# Patient Record
Sex: Female | Born: 1940 | Race: White | Hispanic: No | Marital: Single | Smoking: Former smoker
Health system: Southern US, Community
[De-identification: ages and names within clinical notes are randomized; demographics above are authoritative.]

## PROBLEM LIST (undated history)

## (undated) DIAGNOSIS — K5900 Constipation, unspecified: Secondary | ICD-10-CM

## (undated) DIAGNOSIS — I10 Essential (primary) hypertension: Secondary | ICD-10-CM

## (undated) DIAGNOSIS — E119 Type 2 diabetes mellitus without complications: Secondary | ICD-10-CM

## (undated) DIAGNOSIS — H9190 Unspecified hearing loss, unspecified ear: Secondary | ICD-10-CM

## (undated) DIAGNOSIS — G709 Myoneural disorder, unspecified: Secondary | ICD-10-CM

## (undated) DIAGNOSIS — F32A Depression, unspecified: Secondary | ICD-10-CM

## (undated) DIAGNOSIS — E785 Hyperlipidemia, unspecified: Secondary | ICD-10-CM

## (undated) DIAGNOSIS — Z972 Presence of dental prosthetic device (complete) (partial): Secondary | ICD-10-CM

## (undated) DIAGNOSIS — T7840XA Allergy, unspecified, initial encounter: Secondary | ICD-10-CM

## (undated) DIAGNOSIS — F419 Anxiety disorder, unspecified: Secondary | ICD-10-CM

## (undated) DIAGNOSIS — E1143 Type 2 diabetes mellitus with diabetic autonomic (poly)neuropathy: Secondary | ICD-10-CM

## (undated) DIAGNOSIS — H269 Unspecified cataract: Secondary | ICD-10-CM

## (undated) DIAGNOSIS — K219 Gastro-esophageal reflux disease without esophagitis: Secondary | ICD-10-CM

## (undated) DIAGNOSIS — F329 Major depressive disorder, single episode, unspecified: Secondary | ICD-10-CM

## (undated) HISTORY — DX: Type 2 diabetes mellitus without complications: E11.9

## (undated) HISTORY — DX: Myoneural disorder, unspecified: G70.9

## (undated) HISTORY — DX: Type 2 diabetes mellitus with diabetic autonomic (poly)neuropathy: E11.43

## (undated) HISTORY — DX: Essential (primary) hypertension: I10

## (undated) HISTORY — DX: Allergy, unspecified, initial encounter: T78.40XA

## (undated) HISTORY — DX: Unspecified cataract: H26.9

## (undated) HISTORY — PX: HEMORRHOID SURGERY: SHX153

## (undated) HISTORY — DX: Major depressive disorder, single episode, unspecified: F32.9

## (undated) HISTORY — DX: Presence of dental prosthetic device (complete) (partial): Z97.2

## (undated) HISTORY — DX: Gastro-esophageal reflux disease without esophagitis: K21.9

## (undated) HISTORY — DX: Constipation, unspecified: K59.00

## (undated) HISTORY — DX: Anxiety disorder, unspecified: F41.9

## (undated) HISTORY — DX: Unspecified hearing loss, unspecified ear: H91.90

## (undated) HISTORY — DX: Depression, unspecified: F32.A

## (undated) HISTORY — DX: Hyperlipidemia, unspecified: E78.5

---
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
## 1989-04-16 HISTORY — PX: ABDOMINAL HYSTERECTOMY: SHX81

## 1990-04-16 HISTORY — PX: CHOLECYSTECTOMY: SHX55

## 2000-04-16 HISTORY — PX: KIDNEY SURGERY: SHX687

## 2007-04-17 HISTORY — PX: LUMBAR LAMINECTOMY: SHX95

## 2008-04-23 ENCOUNTER — Ambulatory Visit (HOSPITAL_COMMUNITY): Admission: RE | Admit: 2008-04-23 | Discharge: 2008-04-23 | Payer: Self-pay | Admitting: Endocrinology

## 2009-11-20 ENCOUNTER — Emergency Department (HOSPITAL_COMMUNITY): Admission: EM | Admit: 2009-11-20 | Discharge: 2009-11-20 | Payer: Self-pay | Admitting: Emergency Medicine

## 2012-01-23 IMAGING — CT CT HEAD W/O CM
3 of 6 series · 15 of 47 positions shown, 18 images · non-contrast
Comparison: None.

CT HEAD

CLINICAL DATA: Fall, trauma, headache, neck pain

CT HEAD WITHOUT CONTRAST
CT CERVICAL SPINE WITHOUT CONTRAST
TECHNIQUE: Multidetector CT imaging of the head and cervical spine
was performed following the standard protocol without intravenous
contrast.  Multiplanar CT image reconstructions of the cervical
spine were also generated.

[Series 600: reformatted · coronal · 0.40mm/px · 3 of 61 slices shown (1 of 3)]
[im 21/61  brain]
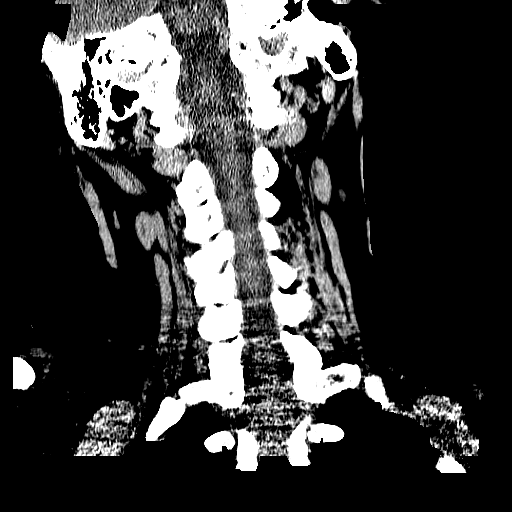
[im 27/61  brain]
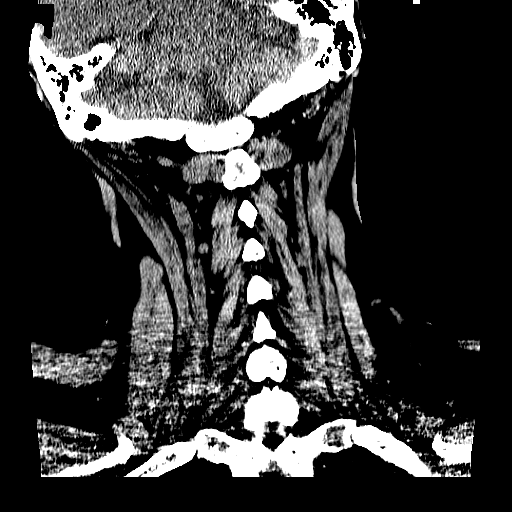
[im 34/61  brain]
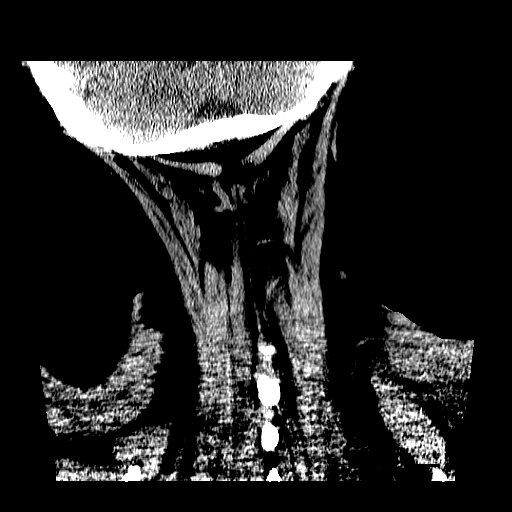

[Series 601: reformatted · sagittal · 0.40mm/px · 3 of 61 slices shown (2 of 3)]
[im 35/61  brain]
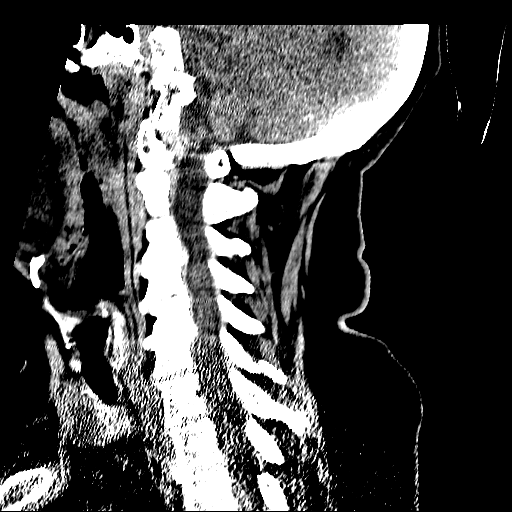
[im 41/61  brain]
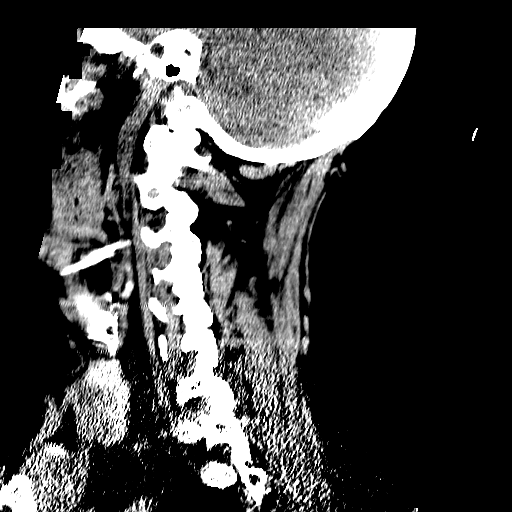
[im 47/61  brain]
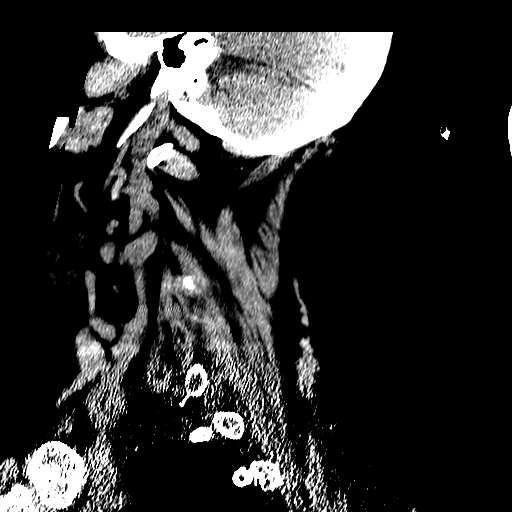

[Series 602: reformatted · axial · 0.40mm/px · z∈[-314,-173]mm · 9 of 96 slices shown, 12 images (3 of 3)]
[im 10/96  brain]
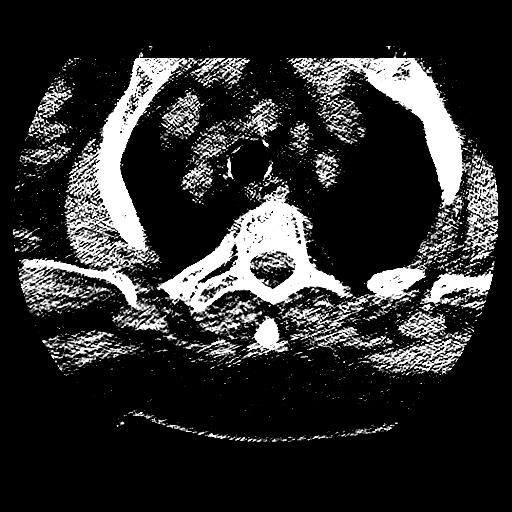
[im 10/96  bone]
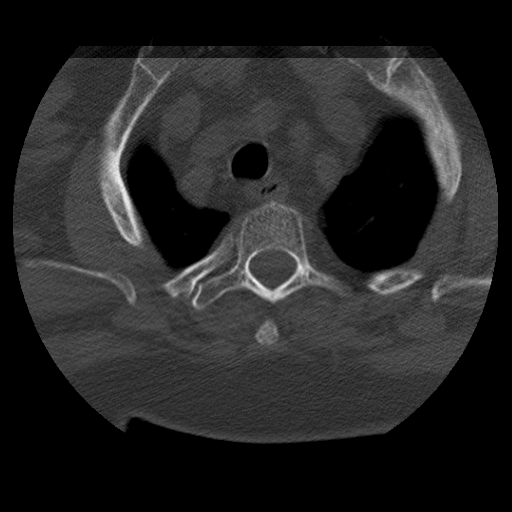
[im 20/96  brain]
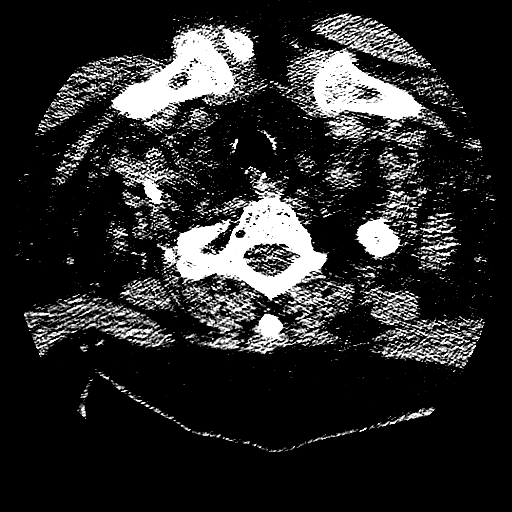
[im 29/96  brain]
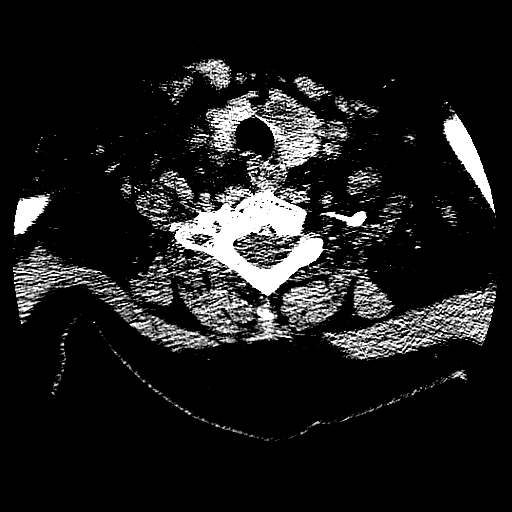
[im 39/96  brain]
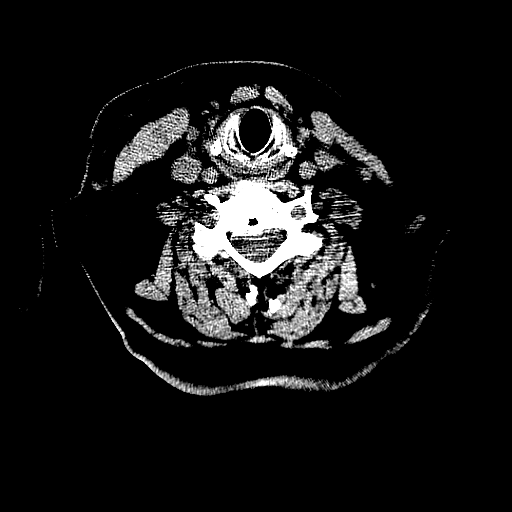
[im 48/96  brain]
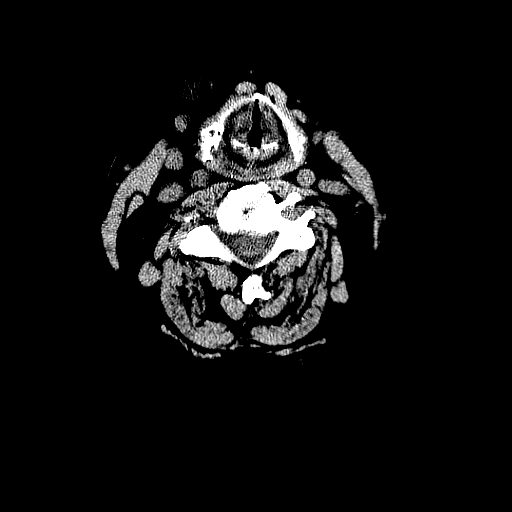
[im 48/96  bone]
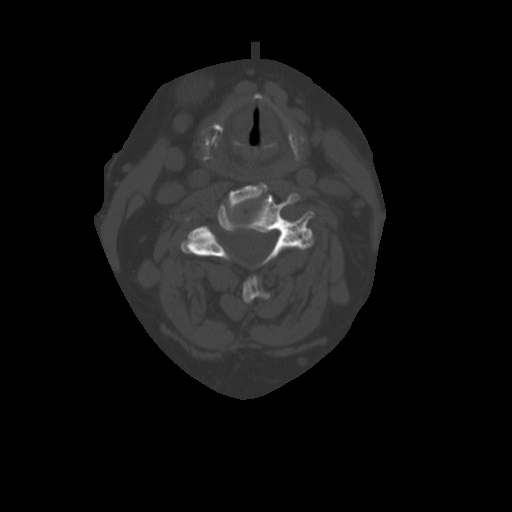
[im 58/96  brain]
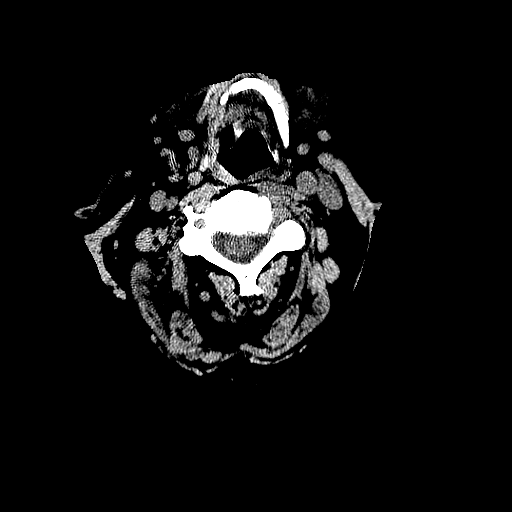
[im 67/96  brain]
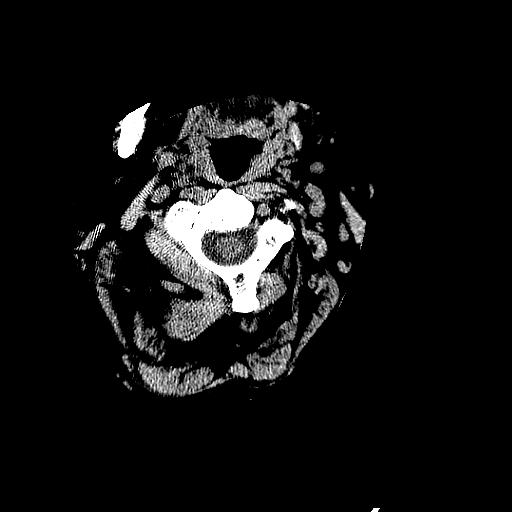
[im 77/96  brain]
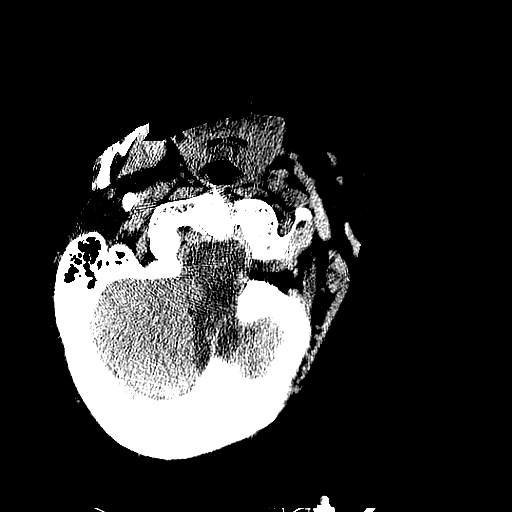
[im 86/96  brain]
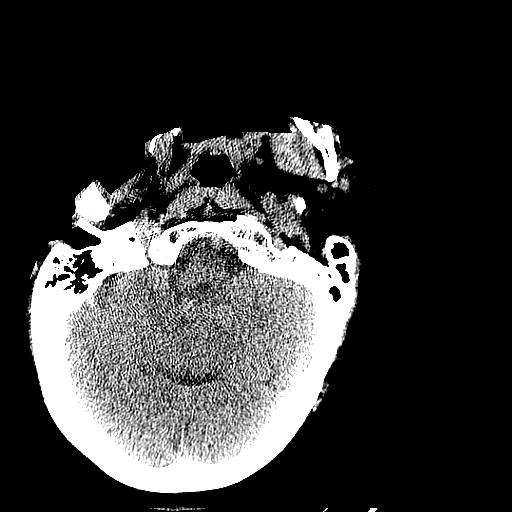
[im 86/96  bone]
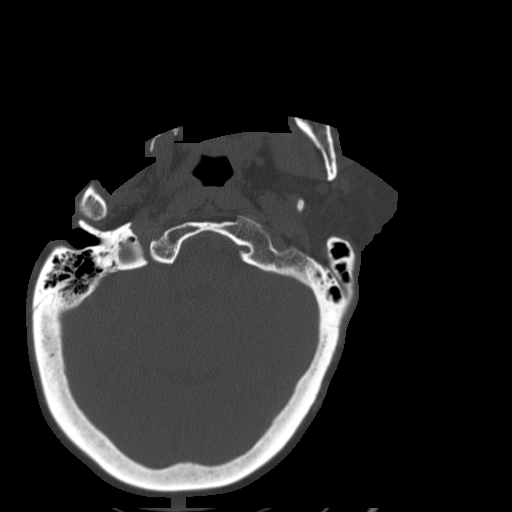

[15 of 47 positions shown; findings below may reference images not displayed]

FINDINGS: No acute intracranial hemorrhage, infarction, midline
shift, herniation, hydrocephalus, or extra-axial fluid collection.
Normal gray-white matter differentiation.  Cisterns patent.  No
cerebellar abnormality.  Mastoids and sinuses visualized clear.  No
skull fracture demonstrated.
IMPRESSION: No acute intracranial finding

CT CERVICAL SPINE
FINDINGS: Normal cervical spine alignment.  Facets aligned.
Foramina patent.  No compression fracture, wedge shaped deformity
or focal kyphosis.  Diffuse cervical degenerative disc disease and
spondylosis from C3-C6.  Normal prevertebral soft tissues.

Left thyroid hypodense lesion noted.  This warrants non emergent
follow-up ultrasound.

No soft tissue asymmetry in the neck or epidural hematoma
demonstrated.
IMPRESSION: No acute fracture or static injury by noncontrast CT
Cervical degenerative changes and spondylosis.

## 2012-04-04 ENCOUNTER — Encounter: Payer: Self-pay | Admitting: Internal Medicine

## 2012-05-09 ENCOUNTER — Telehealth: Payer: Self-pay | Admitting: *Deleted

## 2012-05-09 ENCOUNTER — Ambulatory Visit (AMBULATORY_SURGERY_CENTER): Payer: Medicare Other | Admitting: *Deleted

## 2012-05-09 VITALS — Ht 67.0 in | Wt 220.0 lb

## 2012-05-09 DIAGNOSIS — Z1211 Encounter for screening for malignant neoplasm of colon: Secondary | ICD-10-CM

## 2012-05-09 MED ORDER — MOVIPREP 100 G PO SOLR: ORAL | Status: DC

## 2012-05-09 NOTE — Progress Notes (Signed)
Ms Abigail Peterson had a colon at [facility] 5-6 years ago and doesn't remember if she had a polyp.  She is scheduled for a colon with Dr. Marina Goodell 05/21/12.  Medical release form given to [name]. I will inform [name] to call Dr. Lamar Sprinkles nurse if she hasn't heard from [place] re: receipt of records.

## 2012-05-09 NOTE — Telephone Encounter (Signed)
Ms Abigail Peterson had a colon at [facility] 5-6 years ago and doesn't remember if she had a polyp.  She is scheduled for a colon with Dr. Marina Goodell 05/21/12.  Medical release form given to [name]. I will inform [name] to call Dr. Lamar Sprinkles nurse if she hasn't heard from [place] re: receipt of records.  I was able to contact [name] at 1705 today 05/09/12.  She was instructed to call back 05/16/12 if she has not heard from [place] re: receipt of Medical Records/colon.

## 2012-05-12 ENCOUNTER — Telehealth: Payer: Self-pay | Admitting: Internal Medicine

## 2012-05-12 MED ORDER — PEG 3350-KCL-NABCB-NACL-NASULF 240 G PO SOLR: ORAL | Status: DC

## 2012-05-12 NOTE — Telephone Encounter (Signed)
Ordered colyte prep. No coupons for Movi prep available. Instructions mailed to pt.

## 2012-05-21 ENCOUNTER — Ambulatory Visit (AMBULATORY_SURGERY_CENTER): Payer: Medicare Other | Admitting: Internal Medicine

## 2012-05-21 ENCOUNTER — Encounter: Payer: Self-pay | Admitting: Internal Medicine

## 2012-05-21 VITALS — BP 130/78 | HR 88 | Temp 97.5°F | Resp 15 | Ht 67.0 in | Wt 220.0 lb

## 2012-05-21 DIAGNOSIS — Z1211 Encounter for screening for malignant neoplasm of colon: Secondary | ICD-10-CM

## 2012-05-21 DIAGNOSIS — Z86010 Personal history of colonic polyps: Secondary | ICD-10-CM

## 2012-05-21 DIAGNOSIS — D126 Benign neoplasm of colon, unspecified: Secondary | ICD-10-CM

## 2012-05-21 HISTORY — PX: COLONOSCOPY: SHX174

## 2012-05-21 MED ORDER — SODIUM CHLORIDE 0.9 % IV SOLN: 500.0000 mL | INTRAVENOUS | Status: DC

## 2012-05-21 NOTE — Progress Notes (Signed)
Patient did not experience any of the following events: a burn prior to discharge; a fall within the facility; wrong site/side/patient/procedure/implant event; or a hospital transfer or hospital admission upon discharge from the facility. (G8907) Patient did not have preoperative order for IV antibiotic SSI prophylaxis. (G8918)  

## 2012-05-21 NOTE — Patient Instructions (Signed)
Colon polyps x 2 removed and diverticulosis seen. See handouts given on polyps,diverticulosis and high fiber diet. Resume current medications. Blood sugar was 109. Call us with any questions or concerns. Thank you!!  YOU HAD AN ENDOSCOPIC PROCEDURE TODAY AT THE [facility]: Refer to the procedure report that was given to you for any specific questions about what was found during the examination.  If the procedure report does not answer your questions, please call your gastroenterologist to clarify.  If you requested that your care partner not be given the details of your procedure findings, then the procedure report has been included in a sealed envelope for you to review at your convenience later.  YOU SHOULD EXPECT: Some feelings of bloating in the abdomen. Passage of more gas than usual.  Walking can help get rid of the air that was put into your GI tract during the procedure and reduce the bloating. If you had a lower endoscopy (such as a colonoscopy or flexible sigmoidoscopy) you may notice spotting of blood in your stool or on the toilet paper. If you underwent a bowel prep for your procedure, then you may not have a normal bowel movement for a few days.  DIET: Your first meal following the procedure should be a light meal and then it is ok to progress to your normal diet.  A half-sandwich or bowl of soup is an example of a good first meal.  Heavy or fried foods are harder to digest and may make you feel nauseous or bloated.  Likewise meals heavy in dairy and vegetables can cause extra gas to form and this can also increase the bloating.  Drink plenty of fluids but you should avoid alcoholic beverages for 24 hours.  ACTIVITY: Your care partner should take you home directly after the procedure.  You should plan to take it easy, moving slowly for the rest of the day.  You can resume normal activity the day after the procedure however you should NOT DRIVE or use heavy machinery for 24 hours  (because of the sedation medicines used during the test).    SYMPTOMS TO REPORT IMMEDIATELY: A gastroenterologist can be reached at any hour.  During normal business hours, 8:30 AM to 5:00 PM Monday through Friday, call [phone].  After hours and on weekends, please call the GI answering service at [phone] who will take a message and have the physician on call contact you.   Following lower endoscopy (colonoscopy or flexible sigmoidoscopy):  Excessive amounts of blood in the stool  Significant tenderness or worsening of abdominal pains  Swelling of the abdomen that is new, acute  Fever of 100F or higher  FOLLOW UP: If any biopsies were taken you will be contacted by phone or by letter within the next 1-3 weeks.  Call your gastroenterologist if you have not heard about the biopsies in 3 weeks.  Our staff will call the home number listed on your records the next business day following your procedure to check on you and address any questions or concerns that you may have at that time regarding the information given to you following your procedure. This is a courtesy call and so if there is no answer at the home number and we have not heard from you through the emergency physician on call, we will assume that you have returned to your regular daily activities without incident.  SIGNATURES/CONFIDENTIALITY: You and/or your care partner have signed paperwork which will be entered into your  electronic medical record.  These signatures attest to the fact that that the information above on your After Visit Summary has been reviewed and is understood.  Full responsibility of the confidentiality of this discharge information lies with you and/or your care-partner.

## 2012-05-21 NOTE — Op Note (Signed)
[facility] [address] [place], [address]   COLONOSCOPY PROCEDURE REPORT  PATIENT: [name], [name]  MR#: [id number] BIRTHDATE: 1941-04-10 , 71  yrs. old GENDER: Female ENDOSCOPIST: Roxy Cedar, MD REFERRED AO:[id number] Program Recall PROCEDURE DATE:  05/21/2012 PROCEDURE:   Colonoscopy with snare polypectomy x 3 and Colonoscopy with Submucosal injection, tattoo ink ASA CLASS:   Class II INDICATIONS:Patient's personal history of colon polyps. MEDICATIONS: MAC sedation, administered by CRNA and propofol (Diprivan) 400mg  IV  DESCRIPTION OF PROCEDURE:   After the risks benefits and alternatives of the procedure were thoroughly explained, informed consent was obtained.  A digital rectal exam revealed no abnormalities of the rectum.   The LB [id number]-[id number]  endoscope was introduced through the anus and advanced to the cecum, which was identified by both the appendix and ileocecal valve. No adverse events experienced.   The quality of the prep was good, using MoviPrep  The instrument was then slowly withdrawn as the colon was fully examined.      COLON FINDINGS: Two polyps ranging between 3-45mm in size were found in the ascending colon and transverse colon.  A polypectomy was performed with a cold snare.  The resection was complete and the polyp tissue was completely retrieved. A 10mm sessile distal transverse colon polyp with slight cental umbilication was removed completely with colod snare and retrieved/submitted. [place] ink tattoo placed just distal in two areas.  Moderate diverticulosis was noted The finding was in the left colon.   The colon mucosa was otherwise normal.  Retroflexed views revealed no abnormalities. The time to cecum=4 minutes 15 seconds.  Withdrawal time=21 minutes 11 seconds.  The scope was withdrawn and the procedure completed. COMPLICATIONS: There were no complications.  ENDOSCOPIC IMPRESSION: 1.   Two polyps ranging between  3-18mm in size were found in the ascending colon and transverse colon and 10mm distal transverse polyp were removed as described 2.   Moderate diverticulosis was noted in the left colon 3.   The colon mucosa was otherwise normal  RECOMMENDATIONS: 1. Await pathology results   eSigned:  Roxy Cedar, MD 05/21/2012 9:57 AM   cc: The Patient and W.  Buren Kos, MD   PATIENT NAME:  Abigail Peterson, Abigail Peterson MR#: [id number]

## 2012-05-22 ENCOUNTER — Telehealth: Payer: Self-pay

## 2012-05-22 NOTE — Telephone Encounter (Signed)
Due to network difficulties unable to leave message at this time.

## 2012-05-26 ENCOUNTER — Encounter: Payer: Self-pay | Admitting: Internal Medicine

## 2014-07-06 ENCOUNTER — Other Ambulatory Visit: Payer: Self-pay | Admitting: Internal Medicine

## 2014-07-06 DIAGNOSIS — E669 Obesity, unspecified: Secondary | ICD-10-CM | POA: Diagnosis not present

## 2014-07-06 DIAGNOSIS — E785 Hyperlipidemia, unspecified: Secondary | ICD-10-CM | POA: Diagnosis not present

## 2014-07-06 DIAGNOSIS — M5416 Radiculopathy, lumbar region: Secondary | ICD-10-CM | POA: Diagnosis not present

## 2014-07-06 DIAGNOSIS — G629 Polyneuropathy, unspecified: Secondary | ICD-10-CM | POA: Diagnosis not present

## 2014-07-06 DIAGNOSIS — M545 Low back pain: Secondary | ICD-10-CM

## 2014-07-06 DIAGNOSIS — I10 Essential (primary) hypertension: Secondary | ICD-10-CM | POA: Diagnosis not present

## 2014-07-06 DIAGNOSIS — M79605 Pain in left leg: Secondary | ICD-10-CM | POA: Diagnosis not present

## 2014-07-06 DIAGNOSIS — Z1389 Encounter for screening for other disorder: Secondary | ICD-10-CM | POA: Diagnosis not present

## 2014-07-06 DIAGNOSIS — E1149 Type 2 diabetes mellitus with other diabetic neurological complication: Secondary | ICD-10-CM | POA: Diagnosis not present

## 2014-07-15 DIAGNOSIS — M4727 Other spondylosis with radiculopathy, lumbosacral region: Secondary | ICD-10-CM | POA: Diagnosis not present

## 2014-07-15 DIAGNOSIS — M5116 Intervertebral disc disorders with radiculopathy, lumbar region: Secondary | ICD-10-CM | POA: Diagnosis not present

## 2014-07-15 DIAGNOSIS — M4316 Spondylolisthesis, lumbar region: Secondary | ICD-10-CM | POA: Diagnosis not present

## 2014-07-15 DIAGNOSIS — M4726 Other spondylosis with radiculopathy, lumbar region: Secondary | ICD-10-CM | POA: Diagnosis not present

## 2014-08-06 DIAGNOSIS — M4806 Spinal stenosis, lumbar region: Secondary | ICD-10-CM | POA: Diagnosis not present

## 2014-08-06 DIAGNOSIS — M47816 Spondylosis without myelopathy or radiculopathy, lumbar region: Secondary | ICD-10-CM | POA: Diagnosis not present

## 2014-08-06 DIAGNOSIS — M4317 Spondylolisthesis, lumbosacral region: Secondary | ICD-10-CM | POA: Diagnosis not present

## 2014-08-24 DIAGNOSIS — M47816 Spondylosis without myelopathy or radiculopathy, lumbar region: Secondary | ICD-10-CM | POA: Diagnosis not present

## 2014-08-24 DIAGNOSIS — M4316 Spondylolisthesis, lumbar region: Secondary | ICD-10-CM | POA: Diagnosis not present

## 2014-08-24 DIAGNOSIS — M4806 Spinal stenosis, lumbar region: Secondary | ICD-10-CM | POA: Diagnosis not present

## 2014-08-24 DIAGNOSIS — Z01818 Encounter for other preprocedural examination: Secondary | ICD-10-CM | POA: Diagnosis not present

## 2014-08-24 DIAGNOSIS — M5136 Other intervertebral disc degeneration, lumbar region: Secondary | ICD-10-CM | POA: Diagnosis not present

## 2014-09-10 DIAGNOSIS — M4806 Spinal stenosis, lumbar region: Secondary | ICD-10-CM | POA: Diagnosis not present

## 2014-09-10 DIAGNOSIS — M4317 Spondylolisthesis, lumbosacral region: Secondary | ICD-10-CM | POA: Diagnosis not present

## 2014-09-10 DIAGNOSIS — M47816 Spondylosis without myelopathy or radiculopathy, lumbar region: Secondary | ICD-10-CM | POA: Diagnosis not present

## 2014-11-03 DIAGNOSIS — M5416 Radiculopathy, lumbar region: Secondary | ICD-10-CM | POA: Diagnosis not present

## 2014-11-03 DIAGNOSIS — E1149 Type 2 diabetes mellitus with other diabetic neurological complication: Secondary | ICD-10-CM | POA: Diagnosis not present

## 2014-11-03 DIAGNOSIS — I10 Essential (primary) hypertension: Secondary | ICD-10-CM | POA: Diagnosis not present

## 2014-11-03 DIAGNOSIS — Z6835 Body mass index (BMI) 35.0-35.9, adult: Secondary | ICD-10-CM | POA: Diagnosis not present

## 2014-11-03 DIAGNOSIS — G629 Polyneuropathy, unspecified: Secondary | ICD-10-CM | POA: Diagnosis not present

## 2014-11-03 DIAGNOSIS — E785 Hyperlipidemia, unspecified: Secondary | ICD-10-CM | POA: Diagnosis not present

## 2014-11-03 DIAGNOSIS — E669 Obesity, unspecified: Secondary | ICD-10-CM | POA: Diagnosis not present

## 2014-11-05 DIAGNOSIS — E871 Hypo-osmolality and hyponatremia: Secondary | ICD-10-CM | POA: Diagnosis not present

## 2014-11-05 DIAGNOSIS — G629 Polyneuropathy, unspecified: Secondary | ICD-10-CM | POA: Diagnosis not present

## 2014-11-05 DIAGNOSIS — E785 Hyperlipidemia, unspecified: Secondary | ICD-10-CM | POA: Diagnosis not present

## 2014-11-05 DIAGNOSIS — Z9889 Other specified postprocedural states: Secondary | ICD-10-CM | POA: Diagnosis not present

## 2014-11-05 DIAGNOSIS — M4317 Spondylolisthesis, lumbosacral region: Secondary | ICD-10-CM | POA: Diagnosis not present

## 2014-11-05 DIAGNOSIS — M4806 Spinal stenosis, lumbar region: Secondary | ICD-10-CM | POA: Diagnosis not present

## 2014-11-05 DIAGNOSIS — G9782 Other postprocedural complications and disorders of nervous system: Secondary | ICD-10-CM | POA: Diagnosis not present

## 2014-11-05 DIAGNOSIS — M4807 Spinal stenosis, lumbosacral region: Secondary | ICD-10-CM | POA: Diagnosis not present

## 2014-11-05 DIAGNOSIS — E119 Type 2 diabetes mellitus without complications: Secondary | ICD-10-CM | POA: Diagnosis not present

## 2014-11-05 DIAGNOSIS — K5900 Constipation, unspecified: Secondary | ICD-10-CM | POA: Diagnosis not present

## 2014-11-05 DIAGNOSIS — Z01818 Encounter for other preprocedural examination: Secondary | ICD-10-CM | POA: Diagnosis not present

## 2014-11-05 DIAGNOSIS — M519 Unspecified thoracic, thoracolumbar and lumbosacral intervertebral disc disorder: Secondary | ICD-10-CM | POA: Diagnosis not present

## 2014-11-05 DIAGNOSIS — Z981 Arthrodesis status: Secondary | ICD-10-CM | POA: Diagnosis not present

## 2014-11-05 DIAGNOSIS — M545 Low back pain: Secondary | ICD-10-CM | POA: Diagnosis not present

## 2014-11-05 DIAGNOSIS — G960 Cerebrospinal fluid leak: Secondary | ICD-10-CM | POA: Diagnosis not present

## 2014-11-05 DIAGNOSIS — Z4889 Encounter for other specified surgical aftercare: Secondary | ICD-10-CM | POA: Diagnosis not present

## 2014-11-05 DIAGNOSIS — I10 Essential (primary) hypertension: Secondary | ICD-10-CM | POA: Diagnosis not present

## 2014-11-05 DIAGNOSIS — M4316 Spondylolisthesis, lumbar region: Secondary | ICD-10-CM | POA: Diagnosis not present

## 2014-11-05 DIAGNOSIS — J309 Allergic rhinitis, unspecified: Secondary | ICD-10-CM | POA: Diagnosis not present

## 2014-11-05 DIAGNOSIS — K449 Diaphragmatic hernia without obstruction or gangrene: Secondary | ICD-10-CM | POA: Diagnosis not present

## 2014-11-05 DIAGNOSIS — M47896 Other spondylosis, lumbar region: Secondary | ICD-10-CM | POA: Diagnosis not present

## 2014-11-05 DIAGNOSIS — M47816 Spondylosis without myelopathy or radiculopathy, lumbar region: Secondary | ICD-10-CM | POA: Diagnosis not present

## 2014-11-05 DIAGNOSIS — M4606 Spinal enthesopathy, lumbar region: Secondary | ICD-10-CM | POA: Diagnosis not present

## 2014-11-05 DIAGNOSIS — E876 Hypokalemia: Secondary | ICD-10-CM | POA: Diagnosis not present

## 2014-11-05 DIAGNOSIS — F329 Major depressive disorder, single episode, unspecified: Secondary | ICD-10-CM | POA: Diagnosis not present

## 2014-11-05 DIAGNOSIS — M47897 Other spondylosis, lumbosacral region: Secondary | ICD-10-CM | POA: Diagnosis not present

## 2014-11-08 DIAGNOSIS — E119 Type 2 diabetes mellitus without complications: Secondary | ICD-10-CM | POA: Diagnosis not present

## 2014-11-08 DIAGNOSIS — M4806 Spinal stenosis, lumbar region: Secondary | ICD-10-CM | POA: Diagnosis not present

## 2014-11-08 DIAGNOSIS — M47816 Spondylosis without myelopathy or radiculopathy, lumbar region: Secondary | ICD-10-CM | POA: Diagnosis not present

## 2014-11-08 DIAGNOSIS — I10 Essential (primary) hypertension: Secondary | ICD-10-CM | POA: Diagnosis not present

## 2014-11-08 DIAGNOSIS — M4316 Spondylolisthesis, lumbar region: Secondary | ICD-10-CM | POA: Diagnosis not present

## 2014-11-09 DIAGNOSIS — M519 Unspecified thoracic, thoracolumbar and lumbosacral intervertebral disc disorder: Secondary | ICD-10-CM | POA: Diagnosis not present

## 2014-11-16 DIAGNOSIS — E785 Hyperlipidemia, unspecified: Secondary | ICD-10-CM | POA: Diagnosis not present

## 2014-11-16 DIAGNOSIS — I10 Essential (primary) hypertension: Secondary | ICD-10-CM | POA: Diagnosis not present

## 2014-11-16 DIAGNOSIS — K5900 Constipation, unspecified: Secondary | ICD-10-CM | POA: Diagnosis not present

## 2014-11-16 DIAGNOSIS — M545 Low back pain: Secondary | ICD-10-CM | POA: Diagnosis not present

## 2014-11-16 DIAGNOSIS — J309 Allergic rhinitis, unspecified: Secondary | ICD-10-CM | POA: Diagnosis not present

## 2014-11-16 DIAGNOSIS — G629 Polyneuropathy, unspecified: Secondary | ICD-10-CM | POA: Diagnosis not present

## 2014-11-16 DIAGNOSIS — K449 Diaphragmatic hernia without obstruction or gangrene: Secondary | ICD-10-CM | POA: Diagnosis not present

## 2014-11-16 DIAGNOSIS — Z981 Arthrodesis status: Secondary | ICD-10-CM | POA: Diagnosis not present

## 2014-11-16 DIAGNOSIS — F329 Major depressive disorder, single episode, unspecified: Secondary | ICD-10-CM | POA: Diagnosis not present

## 2014-11-16 DIAGNOSIS — Z4889 Encounter for other specified surgical aftercare: Secondary | ICD-10-CM | POA: Diagnosis not present

## 2014-11-16 DIAGNOSIS — E119 Type 2 diabetes mellitus without complications: Secondary | ICD-10-CM | POA: Diagnosis not present

## 2014-11-27 DIAGNOSIS — Z4789 Encounter for other orthopedic aftercare: Secondary | ICD-10-CM | POA: Diagnosis not present

## 2014-11-27 DIAGNOSIS — E119 Type 2 diabetes mellitus without complications: Secondary | ICD-10-CM | POA: Diagnosis not present

## 2014-11-27 DIAGNOSIS — M150 Primary generalized (osteo)arthritis: Secondary | ICD-10-CM | POA: Diagnosis not present

## 2014-11-27 DIAGNOSIS — R2689 Other abnormalities of gait and mobility: Secondary | ICD-10-CM | POA: Diagnosis not present

## 2014-11-27 DIAGNOSIS — M4316 Spondylolisthesis, lumbar region: Secondary | ICD-10-CM | POA: Diagnosis not present

## 2014-11-27 DIAGNOSIS — Z794 Long term (current) use of insulin: Secondary | ICD-10-CM | POA: Diagnosis not present

## 2014-11-30 DIAGNOSIS — Z4789 Encounter for other orthopedic aftercare: Secondary | ICD-10-CM | POA: Diagnosis not present

## 2014-11-30 DIAGNOSIS — R2689 Other abnormalities of gait and mobility: Secondary | ICD-10-CM | POA: Diagnosis not present

## 2014-11-30 DIAGNOSIS — M150 Primary generalized (osteo)arthritis: Secondary | ICD-10-CM | POA: Diagnosis not present

## 2014-11-30 DIAGNOSIS — E119 Type 2 diabetes mellitus without complications: Secondary | ICD-10-CM | POA: Diagnosis not present

## 2014-11-30 DIAGNOSIS — M4316 Spondylolisthesis, lumbar region: Secondary | ICD-10-CM | POA: Diagnosis not present

## 2014-11-30 DIAGNOSIS — Z794 Long term (current) use of insulin: Secondary | ICD-10-CM | POA: Diagnosis not present

## 2014-12-02 DIAGNOSIS — M150 Primary generalized (osteo)arthritis: Secondary | ICD-10-CM | POA: Diagnosis not present

## 2014-12-02 DIAGNOSIS — E119 Type 2 diabetes mellitus without complications: Secondary | ICD-10-CM | POA: Diagnosis not present

## 2014-12-02 DIAGNOSIS — Z4789 Encounter for other orthopedic aftercare: Secondary | ICD-10-CM | POA: Diagnosis not present

## 2014-12-02 DIAGNOSIS — R2689 Other abnormalities of gait and mobility: Secondary | ICD-10-CM | POA: Diagnosis not present

## 2014-12-02 DIAGNOSIS — Z794 Long term (current) use of insulin: Secondary | ICD-10-CM | POA: Diagnosis not present

## 2014-12-02 DIAGNOSIS — M4316 Spondylolisthesis, lumbar region: Secondary | ICD-10-CM | POA: Diagnosis not present

## 2014-12-03 DIAGNOSIS — M150 Primary generalized (osteo)arthritis: Secondary | ICD-10-CM | POA: Diagnosis not present

## 2014-12-03 DIAGNOSIS — M4317 Spondylolisthesis, lumbosacral region: Secondary | ICD-10-CM | POA: Diagnosis not present

## 2014-12-03 DIAGNOSIS — Z4789 Encounter for other orthopedic aftercare: Secondary | ICD-10-CM | POA: Diagnosis not present

## 2014-12-03 DIAGNOSIS — Z981 Arthrodesis status: Secondary | ICD-10-CM | POA: Diagnosis not present

## 2014-12-03 DIAGNOSIS — M4316 Spondylolisthesis, lumbar region: Secondary | ICD-10-CM | POA: Diagnosis not present

## 2014-12-03 DIAGNOSIS — Z794 Long term (current) use of insulin: Secondary | ICD-10-CM | POA: Diagnosis not present

## 2014-12-03 DIAGNOSIS — M4806 Spinal stenosis, lumbar region: Secondary | ICD-10-CM | POA: Diagnosis not present

## 2014-12-03 DIAGNOSIS — E119 Type 2 diabetes mellitus without complications: Secondary | ICD-10-CM | POA: Diagnosis not present

## 2014-12-03 DIAGNOSIS — M47816 Spondylosis without myelopathy or radiculopathy, lumbar region: Secondary | ICD-10-CM | POA: Diagnosis not present

## 2014-12-03 DIAGNOSIS — R2689 Other abnormalities of gait and mobility: Secondary | ICD-10-CM | POA: Diagnosis not present

## 2014-12-06 DIAGNOSIS — M5416 Radiculopathy, lumbar region: Secondary | ICD-10-CM | POA: Diagnosis not present

## 2014-12-06 DIAGNOSIS — Z9889 Other specified postprocedural states: Secondary | ICD-10-CM | POA: Diagnosis not present

## 2014-12-06 DIAGNOSIS — Z6833 Body mass index (BMI) 33.0-33.9, adult: Secondary | ICD-10-CM | POA: Diagnosis not present

## 2014-12-06 DIAGNOSIS — E785 Hyperlipidemia, unspecified: Secondary | ICD-10-CM | POA: Diagnosis not present

## 2014-12-06 DIAGNOSIS — G629 Polyneuropathy, unspecified: Secondary | ICD-10-CM | POA: Diagnosis not present

## 2014-12-06 DIAGNOSIS — I10 Essential (primary) hypertension: Secondary | ICD-10-CM | POA: Diagnosis not present

## 2014-12-06 DIAGNOSIS — E1149 Type 2 diabetes mellitus with other diabetic neurological complication: Secondary | ICD-10-CM | POA: Diagnosis not present

## 2014-12-07 DIAGNOSIS — Z794 Long term (current) use of insulin: Secondary | ICD-10-CM | POA: Diagnosis not present

## 2014-12-07 DIAGNOSIS — M150 Primary generalized (osteo)arthritis: Secondary | ICD-10-CM | POA: Diagnosis not present

## 2014-12-07 DIAGNOSIS — M4316 Spondylolisthesis, lumbar region: Secondary | ICD-10-CM | POA: Diagnosis not present

## 2014-12-07 DIAGNOSIS — E119 Type 2 diabetes mellitus without complications: Secondary | ICD-10-CM | POA: Diagnosis not present

## 2014-12-07 DIAGNOSIS — R2689 Other abnormalities of gait and mobility: Secondary | ICD-10-CM | POA: Diagnosis not present

## 2014-12-07 DIAGNOSIS — Z4789 Encounter for other orthopedic aftercare: Secondary | ICD-10-CM | POA: Diagnosis not present

## 2014-12-09 DIAGNOSIS — R2689 Other abnormalities of gait and mobility: Secondary | ICD-10-CM | POA: Diagnosis not present

## 2014-12-09 DIAGNOSIS — E119 Type 2 diabetes mellitus without complications: Secondary | ICD-10-CM | POA: Diagnosis not present

## 2014-12-09 DIAGNOSIS — Z794 Long term (current) use of insulin: Secondary | ICD-10-CM | POA: Diagnosis not present

## 2014-12-09 DIAGNOSIS — M150 Primary generalized (osteo)arthritis: Secondary | ICD-10-CM | POA: Diagnosis not present

## 2014-12-09 DIAGNOSIS — M4316 Spondylolisthesis, lumbar region: Secondary | ICD-10-CM | POA: Diagnosis not present

## 2014-12-09 DIAGNOSIS — Z4789 Encounter for other orthopedic aftercare: Secondary | ICD-10-CM | POA: Diagnosis not present

## 2014-12-14 DIAGNOSIS — M4316 Spondylolisthesis, lumbar region: Secondary | ICD-10-CM | POA: Diagnosis not present

## 2014-12-14 DIAGNOSIS — Z794 Long term (current) use of insulin: Secondary | ICD-10-CM | POA: Diagnosis not present

## 2014-12-14 DIAGNOSIS — M150 Primary generalized (osteo)arthritis: Secondary | ICD-10-CM | POA: Diagnosis not present

## 2014-12-14 DIAGNOSIS — Z4789 Encounter for other orthopedic aftercare: Secondary | ICD-10-CM | POA: Diagnosis not present

## 2014-12-14 DIAGNOSIS — R2689 Other abnormalities of gait and mobility: Secondary | ICD-10-CM | POA: Diagnosis not present

## 2014-12-14 DIAGNOSIS — E119 Type 2 diabetes mellitus without complications: Secondary | ICD-10-CM | POA: Diagnosis not present

## 2014-12-16 DIAGNOSIS — Z794 Long term (current) use of insulin: Secondary | ICD-10-CM | POA: Diagnosis not present

## 2014-12-16 DIAGNOSIS — M150 Primary generalized (osteo)arthritis: Secondary | ICD-10-CM | POA: Diagnosis not present

## 2014-12-16 DIAGNOSIS — R2689 Other abnormalities of gait and mobility: Secondary | ICD-10-CM | POA: Diagnosis not present

## 2014-12-16 DIAGNOSIS — M4316 Spondylolisthesis, lumbar region: Secondary | ICD-10-CM | POA: Diagnosis not present

## 2014-12-16 DIAGNOSIS — E119 Type 2 diabetes mellitus without complications: Secondary | ICD-10-CM | POA: Diagnosis not present

## 2014-12-16 DIAGNOSIS — Z4789 Encounter for other orthopedic aftercare: Secondary | ICD-10-CM | POA: Diagnosis not present

## 2014-12-17 DIAGNOSIS — M150 Primary generalized (osteo)arthritis: Secondary | ICD-10-CM | POA: Diagnosis not present

## 2014-12-17 DIAGNOSIS — Z794 Long term (current) use of insulin: Secondary | ICD-10-CM | POA: Diagnosis not present

## 2014-12-17 DIAGNOSIS — E119 Type 2 diabetes mellitus without complications: Secondary | ICD-10-CM | POA: Diagnosis not present

## 2014-12-17 DIAGNOSIS — Z4789 Encounter for other orthopedic aftercare: Secondary | ICD-10-CM | POA: Diagnosis not present

## 2014-12-17 DIAGNOSIS — M4316 Spondylolisthesis, lumbar region: Secondary | ICD-10-CM | POA: Diagnosis not present

## 2014-12-17 DIAGNOSIS — R2689 Other abnormalities of gait and mobility: Secondary | ICD-10-CM | POA: Diagnosis not present

## 2014-12-23 DIAGNOSIS — E119 Type 2 diabetes mellitus without complications: Secondary | ICD-10-CM | POA: Diagnosis not present

## 2014-12-23 DIAGNOSIS — Z794 Long term (current) use of insulin: Secondary | ICD-10-CM | POA: Diagnosis not present

## 2014-12-23 DIAGNOSIS — M4316 Spondylolisthesis, lumbar region: Secondary | ICD-10-CM | POA: Diagnosis not present

## 2014-12-23 DIAGNOSIS — R2689 Other abnormalities of gait and mobility: Secondary | ICD-10-CM | POA: Diagnosis not present

## 2014-12-23 DIAGNOSIS — Z4789 Encounter for other orthopedic aftercare: Secondary | ICD-10-CM | POA: Diagnosis not present

## 2014-12-23 DIAGNOSIS — M150 Primary generalized (osteo)arthritis: Secondary | ICD-10-CM | POA: Diagnosis not present

## 2014-12-24 DIAGNOSIS — M4316 Spondylolisthesis, lumbar region: Secondary | ICD-10-CM | POA: Diagnosis not present

## 2014-12-24 DIAGNOSIS — Z794 Long term (current) use of insulin: Secondary | ICD-10-CM | POA: Diagnosis not present

## 2014-12-24 DIAGNOSIS — M150 Primary generalized (osteo)arthritis: Secondary | ICD-10-CM | POA: Diagnosis not present

## 2014-12-24 DIAGNOSIS — Z4789 Encounter for other orthopedic aftercare: Secondary | ICD-10-CM | POA: Diagnosis not present

## 2014-12-24 DIAGNOSIS — E119 Type 2 diabetes mellitus without complications: Secondary | ICD-10-CM | POA: Diagnosis not present

## 2014-12-24 DIAGNOSIS — R2689 Other abnormalities of gait and mobility: Secondary | ICD-10-CM | POA: Diagnosis not present

## 2014-12-29 DIAGNOSIS — E119 Type 2 diabetes mellitus without complications: Secondary | ICD-10-CM | POA: Diagnosis not present

## 2014-12-29 DIAGNOSIS — R2689 Other abnormalities of gait and mobility: Secondary | ICD-10-CM | POA: Diagnosis not present

## 2014-12-29 DIAGNOSIS — M150 Primary generalized (osteo)arthritis: Secondary | ICD-10-CM | POA: Diagnosis not present

## 2014-12-29 DIAGNOSIS — Z794 Long term (current) use of insulin: Secondary | ICD-10-CM | POA: Diagnosis not present

## 2014-12-29 DIAGNOSIS — M4316 Spondylolisthesis, lumbar region: Secondary | ICD-10-CM | POA: Diagnosis not present

## 2014-12-29 DIAGNOSIS — Z4789 Encounter for other orthopedic aftercare: Secondary | ICD-10-CM | POA: Diagnosis not present

## 2015-01-04 DIAGNOSIS — M150 Primary generalized (osteo)arthritis: Secondary | ICD-10-CM | POA: Diagnosis not present

## 2015-01-04 DIAGNOSIS — E119 Type 2 diabetes mellitus without complications: Secondary | ICD-10-CM | POA: Diagnosis not present

## 2015-01-04 DIAGNOSIS — R2689 Other abnormalities of gait and mobility: Secondary | ICD-10-CM | POA: Diagnosis not present

## 2015-01-04 DIAGNOSIS — Z794 Long term (current) use of insulin: Secondary | ICD-10-CM | POA: Diagnosis not present

## 2015-01-04 DIAGNOSIS — Z4789 Encounter for other orthopedic aftercare: Secondary | ICD-10-CM | POA: Diagnosis not present

## 2015-01-04 DIAGNOSIS — M4316 Spondylolisthesis, lumbar region: Secondary | ICD-10-CM | POA: Diagnosis not present

## 2015-01-05 DIAGNOSIS — E119 Type 2 diabetes mellitus without complications: Secondary | ICD-10-CM | POA: Diagnosis not present

## 2015-01-05 DIAGNOSIS — R2689 Other abnormalities of gait and mobility: Secondary | ICD-10-CM | POA: Diagnosis not present

## 2015-01-05 DIAGNOSIS — Z4789 Encounter for other orthopedic aftercare: Secondary | ICD-10-CM | POA: Diagnosis not present

## 2015-01-05 DIAGNOSIS — M4316 Spondylolisthesis, lumbar region: Secondary | ICD-10-CM | POA: Diagnosis not present

## 2015-01-05 DIAGNOSIS — M150 Primary generalized (osteo)arthritis: Secondary | ICD-10-CM | POA: Diagnosis not present

## 2015-01-05 DIAGNOSIS — Z794 Long term (current) use of insulin: Secondary | ICD-10-CM | POA: Diagnosis not present

## 2015-01-12 DIAGNOSIS — Z4789 Encounter for other orthopedic aftercare: Secondary | ICD-10-CM | POA: Diagnosis not present

## 2015-01-12 DIAGNOSIS — M150 Primary generalized (osteo)arthritis: Secondary | ICD-10-CM | POA: Diagnosis not present

## 2015-01-12 DIAGNOSIS — R2689 Other abnormalities of gait and mobility: Secondary | ICD-10-CM | POA: Diagnosis not present

## 2015-01-12 DIAGNOSIS — M4316 Spondylolisthesis, lumbar region: Secondary | ICD-10-CM | POA: Diagnosis not present

## 2015-01-12 DIAGNOSIS — E119 Type 2 diabetes mellitus without complications: Secondary | ICD-10-CM | POA: Diagnosis not present

## 2015-01-12 DIAGNOSIS — Z794 Long term (current) use of insulin: Secondary | ICD-10-CM | POA: Diagnosis not present

## 2015-01-14 DIAGNOSIS — M4316 Spondylolisthesis, lumbar region: Secondary | ICD-10-CM | POA: Diagnosis not present

## 2015-01-14 DIAGNOSIS — M150 Primary generalized (osteo)arthritis: Secondary | ICD-10-CM | POA: Diagnosis not present

## 2015-01-14 DIAGNOSIS — Z4789 Encounter for other orthopedic aftercare: Secondary | ICD-10-CM | POA: Diagnosis not present

## 2015-01-14 DIAGNOSIS — R2689 Other abnormalities of gait and mobility: Secondary | ICD-10-CM | POA: Diagnosis not present

## 2015-01-14 DIAGNOSIS — Z794 Long term (current) use of insulin: Secondary | ICD-10-CM | POA: Diagnosis not present

## 2015-01-14 DIAGNOSIS — E119 Type 2 diabetes mellitus without complications: Secondary | ICD-10-CM | POA: Diagnosis not present

## 2015-01-18 DIAGNOSIS — E119 Type 2 diabetes mellitus without complications: Secondary | ICD-10-CM | POA: Diagnosis not present

## 2015-01-18 DIAGNOSIS — M150 Primary generalized (osteo)arthritis: Secondary | ICD-10-CM | POA: Diagnosis not present

## 2015-01-18 DIAGNOSIS — M4316 Spondylolisthesis, lumbar region: Secondary | ICD-10-CM | POA: Diagnosis not present

## 2015-01-18 DIAGNOSIS — Z4789 Encounter for other orthopedic aftercare: Secondary | ICD-10-CM | POA: Diagnosis not present

## 2015-01-18 DIAGNOSIS — Z794 Long term (current) use of insulin: Secondary | ICD-10-CM | POA: Diagnosis not present

## 2015-01-18 DIAGNOSIS — R2689 Other abnormalities of gait and mobility: Secondary | ICD-10-CM | POA: Diagnosis not present

## 2015-03-08 DIAGNOSIS — I10 Essential (primary) hypertension: Secondary | ICD-10-CM | POA: Diagnosis not present

## 2015-03-08 DIAGNOSIS — E1149 Type 2 diabetes mellitus with other diabetic neurological complication: Secondary | ICD-10-CM | POA: Diagnosis not present

## 2015-03-08 DIAGNOSIS — E785 Hyperlipidemia, unspecified: Secondary | ICD-10-CM | POA: Diagnosis not present

## 2015-03-16 DIAGNOSIS — G629 Polyneuropathy, unspecified: Secondary | ICD-10-CM | POA: Diagnosis not present

## 2015-03-16 DIAGNOSIS — M5416 Radiculopathy, lumbar region: Secondary | ICD-10-CM | POA: Diagnosis not present

## 2015-03-16 DIAGNOSIS — E781 Pure hyperglyceridemia: Secondary | ICD-10-CM | POA: Diagnosis not present

## 2015-03-16 DIAGNOSIS — Z0000 Encounter for general adult medical examination without abnormal findings: Secondary | ICD-10-CM | POA: Diagnosis not present

## 2015-03-16 DIAGNOSIS — E1149 Type 2 diabetes mellitus with other diabetic neurological complication: Secondary | ICD-10-CM | POA: Diagnosis not present

## 2015-03-16 DIAGNOSIS — I10 Essential (primary) hypertension: Secondary | ICD-10-CM | POA: Diagnosis not present

## 2015-03-16 DIAGNOSIS — Z86010 Personal history of colonic polyps: Secondary | ICD-10-CM | POA: Diagnosis not present

## 2015-03-16 DIAGNOSIS — E785 Hyperlipidemia, unspecified: Secondary | ICD-10-CM | POA: Diagnosis not present

## 2015-06-21 ENCOUNTER — Encounter: Payer: Self-pay | Admitting: Internal Medicine

## 2016-01-24 ENCOUNTER — Encounter: Payer: Self-pay | Admitting: Internal Medicine

## 2016-02-29 ENCOUNTER — Ambulatory Visit: Payer: Medicare Other | Admitting: *Deleted

## 2016-02-29 VITALS — Ht 66.0 in | Wt 216.2 lb

## 2016-02-29 DIAGNOSIS — Z86010 Personal history of colonic polyps: Secondary | ICD-10-CM

## 2016-02-29 MED ORDER — SUPREP BOWEL PREP KIT 17.5-3.13-1.6 GM/177ML PO SOLN: 1.0000 | Freq: Once | ORAL | 0 refills | Status: AC

## 2016-02-29 NOTE — Progress Notes (Signed)
Patient denies any allergies to egg or soy products. Patient denies complications with anesthesia/sedation.  Patient denies oxygen use at home and denies diet medications. Emmi instructions for colonoscopy  explained but patient denied.  Printed letter, patient then informed me she does not take byetta.

## 2016-03-13 ENCOUNTER — Encounter: Payer: Self-pay | Admitting: Internal Medicine

## 2016-03-22 ENCOUNTER — Encounter: Payer: Self-pay | Admitting: Internal Medicine

## 2016-03-22 ENCOUNTER — Ambulatory Visit (AMBULATORY_SURGERY_CENTER): Payer: Medicare Other | Admitting: Internal Medicine

## 2016-03-22 VITALS — BP 107/68 | HR 86 | Temp 96.8°F | Resp 19 | Ht 66.0 in | Wt 216.0 lb

## 2016-03-22 DIAGNOSIS — Z86010 Personal history of colonic polyps: Secondary | ICD-10-CM

## 2016-03-22 DIAGNOSIS — D122 Benign neoplasm of ascending colon: Secondary | ICD-10-CM

## 2016-03-22 DIAGNOSIS — D126 Benign neoplasm of colon, unspecified: Secondary | ICD-10-CM | POA: Diagnosis not present

## 2016-03-22 MED ORDER — SODIUM CHLORIDE 0.9 % IV SOLN: 500.0000 mL | INTRAVENOUS | Status: AC

## 2016-03-22 NOTE — Progress Notes (Signed)
Report to PACU, RN, vss, BBS= Clear.  

## 2016-03-22 NOTE — Progress Notes (Signed)
Called to room to assist during endoscopic procedure.  Patient ID and intended procedure confirmed with present staff. Received instructions for my participation in the procedure from the performing physician.  

## 2016-03-22 NOTE — Patient Instructions (Signed)
Impression/Recommendations:  Diverticulosis handout given to patient.  YOU HAD AN ENDOSCOPIC PROCEDURE TODAY AT THE  ENDOSCOPY CENTER:   Refer to the procedure report that was given to you for any specific questions about what was found during the examination.  If the procedure report does not answer your questions, please call your gastroenterologist to clarify.  If you requested that your care partner not be given the details of your procedure findings, then the procedure report has been included in a sealed envelope for you to review at your convenience later.  YOU SHOULD EXPECT: Some feelings of bloating in the abdomen. Passage of more gas than usual.  Walking can help get rid of the air that was put into your GI tract during the procedure and reduce the bloating. If you had a lower endoscopy (such as a colonoscopy or flexible sigmoidoscopy) you may notice spotting of blood in your stool or on the toilet paper. If you underwent a bowel prep for your procedure, you may not have a normal bowel movement for a few days.  Please Note:  You might notice some irritation and congestion in your nose or some drainage.  This is from the oxygen used during your procedure.  There is no need for concern and it should clear up in a day or so.  SYMPTOMS TO REPORT IMMEDIATELY:   Following lower endoscopy (colonoscopy or flexible sigmoidoscopy):  Excessive amounts of blood in the stool  Significant tenderness or worsening of abdominal pains  Swelling of the abdomen that is new, acute  Fever of 100F or higher For urgent or emergent issues, a gastroenterologist can be reached at any hour by calling [phone].   DIET:  We do recommend a small meal at first, but then you may proceed to your regular diet.  Drink plenty of fluids but you should avoid alcoholic beverages for 24 hours.  ACTIVITY:  You should plan to take it easy for the rest of today and you should NOT DRIVE or use heavy machinery  until tomorrow (because of the sedation medicines used during the test).    FOLLOW UP: Our staff will call the number listed on your records the next business day following your procedure to check on you and address any questions or concerns that you may have regarding the information given to you following your procedure. If we do not reach you, we will leave a message.  However, if you are feeling well and you are not experiencing any problems, there is no need to return our call.  We will assume that you have returned to your regular daily activities without incident.  If any biopsies were taken you will be contacted by phone or by letter within the next 1-3 weeks.  Please call us at [phone] if you have not heard about the biopsies in 3 weeks.    SIGNATURES/CONFIDENTIALITY: You and/or your care partner have signed paperwork which will be entered into your electronic medical record.  These signatures attest to the fact that that the information above on your After Visit Summary has been reviewed and is understood.  Full responsibility of the confidentiality of this discharge information lies with you and/or your care-partner. 

## 2016-03-22 NOTE — Op Note (Signed)
[facility] Patient Name: Abigail Peterson Procedure Date: 03/22/2016 10:29 AM MRN: [id number] Endoscopist: Docia Chuck. Henrene Pastor , MD Age: 75 Referring MD:  Date of Birth: 04-21-1940 Gender: Female Account #: [id number] Procedure:                Colonoscopy, with cold snare polypectomy x 1 Indications:              High risk colon cancer surveillance: Personal                            history of multiple (3 or more) adenomas. Last                            examination 2012 with 2 tubular adenomas and one SSP Medicines:                Monitored Anesthesia Care Procedure:                Pre-Anesthesia Assessment:                           - Prior to the procedure, a History and Physical                            was performed, and patient medications and                            allergies were reviewed. The patient's tolerance of                            previous anesthesia was also reviewed. The risks                            and benefits of the procedure and the sedation                            options and risks were discussed with the patient.                            All questions were answered, and informed consent                            was obtained. Prior Anticoagulants: The patient has                            taken no previous anticoagulant or antiplatelet                            agents. ASA Grade Assessment: II - A patient with                            mild systemic disease. After reviewing the risks                            and benefits, the patient was deemed in  satisfactory condition to undergo the procedure.                           After obtaining informed consent, the colonoscope                            was passed under direct vision. Throughout the                            procedure, the patient's blood pressure, pulse, and                            oxygen saturations were monitored continuously. The                 Model CF-HQ190L [id number]) scope was introduced                            through the anus and advanced to the the cecum,                            identified by appendiceal orifice and ileocecal                            valve. The ileocecal valve, appendiceal orifice,                            and rectum were photographed. The quality of the                            bowel preparation was good. The colonoscopy was                            performed without difficulty. The patient tolerated                            the procedure well. The bowel preparation used was                            SUPREP. Scope In: 10:39:46 AM Scope Out: 10:55:24 AM Scope Withdrawal Time: 0 hours 12 minutes 8 seconds  Total Procedure Duration: 0 hours 15 minutes 38 seconds  Findings:                 A 1 mm polyp was found in the ascending colon. The                            polyp was removed with a cold snare. Resection and                            retrieval were complete.                           Multiple diverticula were found in the left colon.  The exam was otherwise without abnormality on                            direct and retroflexion views. Complications:            No immediate complications. Estimated blood loss:                            None. Estimated Blood Loss:     Estimated blood loss: none. Impression:               - Diverticulosis in the left colon.                           - Diminutive descending colon polyp removed with                            cold snare                           - The examination was otherwise normal on direct                            and retroflexion views.                           - No specimens collected. Recommendation:           - Repeat colonoscopy is not recommended for                            surveillance.                           - Patient has a contact number available for                             emergencies. The signs and symptoms of potential                            delayed complications were discussed with the                            patient. Return to normal activities tomorrow.                            Written discharge instructions were provided to the                            patient.                           - Resume previous diet.                           - Continue present medications. Docia Chuck. Henrene Pastor, MD 03/22/2016 11:02:13 AM This report has been signed electronically.

## 2016-03-23 ENCOUNTER — Telehealth: Payer: Self-pay | Admitting: *Deleted

## 2016-03-23 NOTE — Telephone Encounter (Signed)
  Follow up Call-  Call back number 03/22/2016  Post procedure Call Back phone  # [phone]  Permission to leave phone message Yes  Some recent data might be hidden     Patient questions:  Do you have a fever, pain , or abdominal swelling? No. Pain Score  0 *  Have you tolerated food without any problems? Yes.    Have you been able to return to your normal activities? Yes.    Do you have any questions about your discharge instructions: Diet   No. Medications  No. Follow up visit  No.  Do you have questions or concerns about your Care? No.  Actions: * If pain score is 4 or above: No action needed, pain <4.

## 2016-03-30 ENCOUNTER — Encounter: Payer: Self-pay | Admitting: Internal Medicine

## 2017-04-26 DIAGNOSIS — Z1231 Encounter for screening mammogram for malignant neoplasm of breast: Secondary | ICD-10-CM | POA: Diagnosis not present

## 2017-05-28 DIAGNOSIS — M47816 Spondylosis without myelopathy or radiculopathy, lumbar region: Secondary | ICD-10-CM | POA: Diagnosis not present

## 2017-06-05 DIAGNOSIS — J0100 Acute maxillary sinusitis, unspecified: Secondary | ICD-10-CM | POA: Diagnosis not present

## 2017-06-05 DIAGNOSIS — H6502 Acute serous otitis media, left ear: Secondary | ICD-10-CM | POA: Diagnosis not present

## 2017-07-25 DIAGNOSIS — J301 Allergic rhinitis due to pollen: Secondary | ICD-10-CM | POA: Diagnosis not present

## 2017-07-25 DIAGNOSIS — J0100 Acute maxillary sinusitis, unspecified: Secondary | ICD-10-CM | POA: Diagnosis not present

## 2017-09-23 DIAGNOSIS — J301 Allergic rhinitis due to pollen: Secondary | ICD-10-CM | POA: Diagnosis not present

## 2017-09-23 DIAGNOSIS — E119 Type 2 diabetes mellitus without complications: Secondary | ICD-10-CM | POA: Diagnosis not present

## 2017-09-23 DIAGNOSIS — R05 Cough: Secondary | ICD-10-CM | POA: Diagnosis not present

## 2017-09-23 DIAGNOSIS — J329 Chronic sinusitis, unspecified: Secondary | ICD-10-CM | POA: Diagnosis not present

## 2017-10-09 DIAGNOSIS — I10 Essential (primary) hypertension: Secondary | ICD-10-CM | POA: Diagnosis not present

## 2017-10-09 DIAGNOSIS — D72829 Elevated white blood cell count, unspecified: Secondary | ICD-10-CM | POA: Diagnosis not present

## 2017-10-09 DIAGNOSIS — N182 Chronic kidney disease, stage 2 (mild): Secondary | ICD-10-CM | POA: Diagnosis not present

## 2017-10-09 DIAGNOSIS — E1149 Type 2 diabetes mellitus with other diabetic neurological complication: Secondary | ICD-10-CM | POA: Diagnosis not present

## 2017-10-09 DIAGNOSIS — R05 Cough: Secondary | ICD-10-CM | POA: Diagnosis not present

## 2017-10-09 DIAGNOSIS — E1129 Type 2 diabetes mellitus with other diabetic kidney complication: Secondary | ICD-10-CM | POA: Diagnosis not present

## 2017-10-09 DIAGNOSIS — R0609 Other forms of dyspnea: Secondary | ICD-10-CM | POA: Diagnosis not present

## 2017-10-09 DIAGNOSIS — E668 Other obesity: Secondary | ICD-10-CM | POA: Diagnosis not present

## 2017-10-09 DIAGNOSIS — E7849 Other hyperlipidemia: Secondary | ICD-10-CM | POA: Diagnosis not present

## 2017-12-17 DIAGNOSIS — Z961 Presence of intraocular lens: Secondary | ICD-10-CM | POA: Diagnosis not present

## 2017-12-17 DIAGNOSIS — Z7984 Long term (current) use of oral hypoglycemic drugs: Secondary | ICD-10-CM | POA: Diagnosis not present

## 2017-12-17 DIAGNOSIS — H353131 Nonexudative age-related macular degeneration, bilateral, early dry stage: Secondary | ICD-10-CM | POA: Diagnosis not present

## 2017-12-17 DIAGNOSIS — E119 Type 2 diabetes mellitus without complications: Secondary | ICD-10-CM | POA: Diagnosis not present

## 2017-12-27 DIAGNOSIS — S40262A Insect bite (nonvenomous) of left shoulder, initial encounter: Secondary | ICD-10-CM | POA: Diagnosis not present

## 2017-12-27 DIAGNOSIS — W57XXXA Bitten or stung by nonvenomous insect and other nonvenomous arthropods, initial encounter: Secondary | ICD-10-CM | POA: Diagnosis not present

## 2018-01-28 DIAGNOSIS — R350 Frequency of micturition: Secondary | ICD-10-CM | POA: Diagnosis not present

## 2018-01-28 DIAGNOSIS — N390 Urinary tract infection, site not specified: Secondary | ICD-10-CM | POA: Diagnosis not present

## 2018-01-28 DIAGNOSIS — R3129 Other microscopic hematuria: Secondary | ICD-10-CM | POA: Diagnosis not present

## 2018-01-28 DIAGNOSIS — R1084 Generalized abdominal pain: Secondary | ICD-10-CM | POA: Diagnosis not present

## 2018-01-28 DIAGNOSIS — Z6833 Body mass index (BMI) 33.0-33.9, adult: Secondary | ICD-10-CM | POA: Diagnosis not present

## 2018-01-28 DIAGNOSIS — I10 Essential (primary) hypertension: Secondary | ICD-10-CM | POA: Diagnosis not present

## 2018-01-28 DIAGNOSIS — W57XXXA Bitten or stung by nonvenomous insect and other nonvenomous arthropods, initial encounter: Secondary | ICD-10-CM | POA: Diagnosis not present

## 2018-04-11 DIAGNOSIS — I10 Essential (primary) hypertension: Secondary | ICD-10-CM | POA: Diagnosis not present

## 2018-04-11 DIAGNOSIS — R82998 Other abnormal findings in urine: Secondary | ICD-10-CM | POA: Diagnosis not present

## 2018-04-11 DIAGNOSIS — E1129 Type 2 diabetes mellitus with other diabetic kidney complication: Secondary | ICD-10-CM | POA: Diagnosis not present

## 2018-04-11 DIAGNOSIS — E7849 Other hyperlipidemia: Secondary | ICD-10-CM | POA: Diagnosis not present

## 2018-04-18 DIAGNOSIS — E1149 Type 2 diabetes mellitus with other diabetic neurological complication: Secondary | ICD-10-CM | POA: Diagnosis not present

## 2018-04-18 DIAGNOSIS — G6289 Other specified polyneuropathies: Secondary | ICD-10-CM | POA: Diagnosis not present

## 2018-04-18 DIAGNOSIS — E1129 Type 2 diabetes mellitus with other diabetic kidney complication: Secondary | ICD-10-CM | POA: Diagnosis not present

## 2018-04-18 DIAGNOSIS — E781 Pure hyperglyceridemia: Secondary | ICD-10-CM | POA: Diagnosis not present

## 2018-04-18 DIAGNOSIS — E7849 Other hyperlipidemia: Secondary | ICD-10-CM | POA: Diagnosis not present

## 2018-04-18 DIAGNOSIS — Z0000 Encounter for general adult medical examination without abnormal findings: Secondary | ICD-10-CM | POA: Diagnosis not present

## 2018-04-18 DIAGNOSIS — D72829 Elevated white blood cell count, unspecified: Secondary | ICD-10-CM | POA: Diagnosis not present

## 2018-04-18 DIAGNOSIS — N182 Chronic kidney disease, stage 2 (mild): Secondary | ICD-10-CM | POA: Diagnosis not present

## 2018-04-18 DIAGNOSIS — I10 Essential (primary) hypertension: Secondary | ICD-10-CM | POA: Diagnosis not present

## 2018-04-24 DIAGNOSIS — Z1212 Encounter for screening for malignant neoplasm of rectum: Secondary | ICD-10-CM | POA: Diagnosis not present

## 2018-05-14 DIAGNOSIS — M7072 Other bursitis of hip, left hip: Secondary | ICD-10-CM | POA: Diagnosis not present

## 2018-05-16 DIAGNOSIS — Z981 Arthrodesis status: Secondary | ICD-10-CM | POA: Diagnosis not present

## 2018-05-16 DIAGNOSIS — M25552 Pain in left hip: Secondary | ICD-10-CM | POA: Diagnosis not present

## 2018-05-16 DIAGNOSIS — T849XXA Unspecified complication of internal orthopedic prosthetic device, implant and graft, initial encounter: Secondary | ICD-10-CM | POA: Diagnosis not present

## 2018-05-16 DIAGNOSIS — M545 Low back pain: Secondary | ICD-10-CM | POA: Diagnosis not present

## 2018-05-23 DIAGNOSIS — B349 Viral infection, unspecified: Secondary | ICD-10-CM | POA: Diagnosis not present

## 2018-06-27 DIAGNOSIS — M545 Low back pain: Secondary | ICD-10-CM | POA: Diagnosis not present

## 2018-06-27 DIAGNOSIS — W07XXXS Fall from chair, sequela: Secondary | ICD-10-CM | POA: Diagnosis not present

## 2018-10-09 DIAGNOSIS — E669 Obesity, unspecified: Secondary | ICD-10-CM | POA: Diagnosis not present

## 2018-10-09 DIAGNOSIS — I129 Hypertensive chronic kidney disease with stage 1 through stage 4 chronic kidney disease, or unspecified chronic kidney disease: Secondary | ICD-10-CM | POA: Diagnosis not present

## 2018-10-09 DIAGNOSIS — E1149 Type 2 diabetes mellitus with other diabetic neurological complication: Secondary | ICD-10-CM | POA: Diagnosis not present

## 2018-10-09 DIAGNOSIS — D72829 Elevated white blood cell count, unspecified: Secondary | ICD-10-CM | POA: Diagnosis not present

## 2018-10-09 DIAGNOSIS — N182 Chronic kidney disease, stage 2 (mild): Secondary | ICD-10-CM | POA: Diagnosis not present

## 2018-10-09 DIAGNOSIS — E785 Hyperlipidemia, unspecified: Secondary | ICD-10-CM | POA: Diagnosis not present

## 2018-10-09 DIAGNOSIS — E1129 Type 2 diabetes mellitus with other diabetic kidney complication: Secondary | ICD-10-CM | POA: Diagnosis not present

## 2018-10-16 DIAGNOSIS — I129 Hypertensive chronic kidney disease with stage 1 through stage 4 chronic kidney disease, or unspecified chronic kidney disease: Secondary | ICD-10-CM | POA: Diagnosis not present

## 2018-10-16 DIAGNOSIS — E1149 Type 2 diabetes mellitus with other diabetic neurological complication: Secondary | ICD-10-CM | POA: Diagnosis not present

## 2018-11-24 DIAGNOSIS — M545 Low back pain: Secondary | ICD-10-CM | POA: Diagnosis not present

## 2018-12-10 DIAGNOSIS — M25552 Pain in left hip: Secondary | ICD-10-CM | POA: Diagnosis not present

## 2018-12-10 DIAGNOSIS — M79601 Pain in right arm: Secondary | ICD-10-CM | POA: Diagnosis not present

## 2018-12-10 DIAGNOSIS — L089 Local infection of the skin and subcutaneous tissue, unspecified: Secondary | ICD-10-CM | POA: Diagnosis not present

## 2018-12-11 DIAGNOSIS — M79601 Pain in right arm: Secondary | ICD-10-CM | POA: Diagnosis not present

## 2018-12-11 DIAGNOSIS — L089 Local infection of the skin and subcutaneous tissue, unspecified: Secondary | ICD-10-CM | POA: Diagnosis not present

## 2018-12-11 DIAGNOSIS — M25552 Pain in left hip: Secondary | ICD-10-CM | POA: Diagnosis not present

## 2018-12-11 DIAGNOSIS — M25521 Pain in right elbow: Secondary | ICD-10-CM | POA: Diagnosis not present

## 2018-12-11 DIAGNOSIS — M160 Bilateral primary osteoarthritis of hip: Secondary | ICD-10-CM | POA: Diagnosis not present

## 2018-12-29 DIAGNOSIS — L500 Allergic urticaria: Secondary | ICD-10-CM | POA: Diagnosis not present

## 2019-04-21 DIAGNOSIS — E7849 Other hyperlipidemia: Secondary | ICD-10-CM | POA: Diagnosis not present

## 2019-04-21 DIAGNOSIS — E1129 Type 2 diabetes mellitus with other diabetic kidney complication: Secondary | ICD-10-CM | POA: Diagnosis not present

## 2019-04-24 DIAGNOSIS — M5416 Radiculopathy, lumbar region: Secondary | ICD-10-CM | POA: Diagnosis not present

## 2019-04-24 DIAGNOSIS — Z0000 Encounter for general adult medical examination without abnormal findings: Secondary | ICD-10-CM | POA: Diagnosis not present

## 2019-04-24 DIAGNOSIS — E1129 Type 2 diabetes mellitus with other diabetic kidney complication: Secondary | ICD-10-CM | POA: Diagnosis not present

## 2019-04-24 DIAGNOSIS — E785 Hyperlipidemia, unspecified: Secondary | ICD-10-CM | POA: Diagnosis not present

## 2019-04-24 DIAGNOSIS — N182 Chronic kidney disease, stage 2 (mild): Secondary | ICD-10-CM | POA: Diagnosis not present

## 2019-04-24 DIAGNOSIS — E781 Pure hyperglyceridemia: Secondary | ICD-10-CM | POA: Diagnosis not present

## 2019-04-24 DIAGNOSIS — I10 Essential (primary) hypertension: Secondary | ICD-10-CM | POA: Diagnosis not present

## 2019-04-24 DIAGNOSIS — D72829 Elevated white blood cell count, unspecified: Secondary | ICD-10-CM | POA: Diagnosis not present

## 2019-04-24 DIAGNOSIS — G629 Polyneuropathy, unspecified: Secondary | ICD-10-CM | POA: Diagnosis not present

## 2019-05-05 DIAGNOSIS — Z23 Encounter for immunization: Secondary | ICD-10-CM | POA: Diagnosis not present

## 2019-07-29 DIAGNOSIS — H353131 Nonexudative age-related macular degeneration, bilateral, early dry stage: Secondary | ICD-10-CM | POA: Diagnosis not present

## 2019-07-29 DIAGNOSIS — H35363 Drusen (degenerative) of macula, bilateral: Secondary | ICD-10-CM | POA: Diagnosis not present

## 2019-07-29 DIAGNOSIS — H538 Other visual disturbances: Secondary | ICD-10-CM | POA: Diagnosis not present

## 2019-07-29 DIAGNOSIS — Z961 Presence of intraocular lens: Secondary | ICD-10-CM | POA: Diagnosis not present

## 2019-07-29 DIAGNOSIS — Z9842 Cataract extraction status, left eye: Secondary | ICD-10-CM | POA: Diagnosis not present

## 2019-07-29 DIAGNOSIS — H26493 Other secondary cataract, bilateral: Secondary | ICD-10-CM | POA: Diagnosis not present

## 2019-07-29 DIAGNOSIS — E119 Type 2 diabetes mellitus without complications: Secondary | ICD-10-CM | POA: Diagnosis not present

## 2019-07-29 DIAGNOSIS — Z9841 Cataract extraction status, right eye: Secondary | ICD-10-CM | POA: Diagnosis not present

## 2019-07-29 DIAGNOSIS — H43813 Vitreous degeneration, bilateral: Secondary | ICD-10-CM | POA: Diagnosis not present

## 2019-08-25 DIAGNOSIS — H26491 Other secondary cataract, right eye: Secondary | ICD-10-CM | POA: Diagnosis not present

## 2019-09-04 DIAGNOSIS — Z1231 Encounter for screening mammogram for malignant neoplasm of breast: Secondary | ICD-10-CM | POA: Diagnosis not present

## 2019-09-09 DIAGNOSIS — H26491 Other secondary cataract, right eye: Secondary | ICD-10-CM | POA: Diagnosis not present

## 2019-09-09 DIAGNOSIS — H43813 Vitreous degeneration, bilateral: Secondary | ICD-10-CM | POA: Diagnosis not present

## 2019-09-09 DIAGNOSIS — Z48810 Encounter for surgical aftercare following surgery on the sense organs: Secondary | ICD-10-CM | POA: Diagnosis not present

## 2019-09-09 DIAGNOSIS — Z9889 Other specified postprocedural states: Secondary | ICD-10-CM | POA: Diagnosis not present

## 2019-09-09 DIAGNOSIS — H353131 Nonexudative age-related macular degeneration, bilateral, early dry stage: Secondary | ICD-10-CM | POA: Diagnosis not present

## 2019-09-09 DIAGNOSIS — Z7984 Long term (current) use of oral hypoglycemic drugs: Secondary | ICD-10-CM | POA: Diagnosis not present

## 2019-09-09 DIAGNOSIS — E119 Type 2 diabetes mellitus without complications: Secondary | ICD-10-CM | POA: Diagnosis not present

## 2019-09-09 DIAGNOSIS — H5210 Myopia, unspecified eye: Secondary | ICD-10-CM | POA: Diagnosis not present

## 2019-09-09 DIAGNOSIS — H524 Presbyopia: Secondary | ICD-10-CM | POA: Diagnosis not present

## 2019-10-07 DIAGNOSIS — Z961 Presence of intraocular lens: Secondary | ICD-10-CM | POA: Diagnosis not present

## 2019-10-07 DIAGNOSIS — H353131 Nonexudative age-related macular degeneration, bilateral, early dry stage: Secondary | ICD-10-CM | POA: Diagnosis not present

## 2019-10-07 DIAGNOSIS — E119 Type 2 diabetes mellitus without complications: Secondary | ICD-10-CM | POA: Diagnosis not present

## 2019-10-07 DIAGNOSIS — Z48810 Encounter for surgical aftercare following surgery on the sense organs: Secondary | ICD-10-CM | POA: Diagnosis not present

## 2019-10-07 DIAGNOSIS — H26493 Other secondary cataract, bilateral: Secondary | ICD-10-CM | POA: Diagnosis not present

## 2019-10-07 DIAGNOSIS — Z9889 Other specified postprocedural states: Secondary | ICD-10-CM | POA: Diagnosis not present

## 2019-10-07 DIAGNOSIS — H524 Presbyopia: Secondary | ICD-10-CM | POA: Diagnosis not present

## 2019-10-07 DIAGNOSIS — H43813 Vitreous degeneration, bilateral: Secondary | ICD-10-CM | POA: Diagnosis not present

## 2019-10-07 DIAGNOSIS — H5213 Myopia, bilateral: Secondary | ICD-10-CM | POA: Diagnosis not present

## 2019-10-26 DIAGNOSIS — E781 Pure hyperglyceridemia: Secondary | ICD-10-CM | POA: Diagnosis not present

## 2019-10-26 DIAGNOSIS — E1129 Type 2 diabetes mellitus with other diabetic kidney complication: Secondary | ICD-10-CM | POA: Diagnosis not present

## 2019-10-26 DIAGNOSIS — E669 Obesity, unspecified: Secondary | ICD-10-CM | POA: Diagnosis not present

## 2019-10-26 DIAGNOSIS — E785 Hyperlipidemia, unspecified: Secondary | ICD-10-CM | POA: Diagnosis not present

## 2019-10-26 DIAGNOSIS — I129 Hypertensive chronic kidney disease with stage 1 through stage 4 chronic kidney disease, or unspecified chronic kidney disease: Secondary | ICD-10-CM | POA: Diagnosis not present

## 2019-10-26 DIAGNOSIS — N182 Chronic kidney disease, stage 2 (mild): Secondary | ICD-10-CM | POA: Diagnosis not present

## 2019-10-26 DIAGNOSIS — G629 Polyneuropathy, unspecified: Secondary | ICD-10-CM | POA: Diagnosis not present

## 2019-10-26 DIAGNOSIS — G5601 Carpal tunnel syndrome, right upper limb: Secondary | ICD-10-CM | POA: Diagnosis not present

## 2020-04-22 DIAGNOSIS — E1129 Type 2 diabetes mellitus with other diabetic kidney complication: Secondary | ICD-10-CM | POA: Diagnosis not present

## 2020-04-22 DIAGNOSIS — E785 Hyperlipidemia, unspecified: Secondary | ICD-10-CM | POA: Diagnosis not present

## 2020-04-29 DIAGNOSIS — E1129 Type 2 diabetes mellitus with other diabetic kidney complication: Secondary | ICD-10-CM | POA: Diagnosis not present

## 2020-04-29 DIAGNOSIS — G629 Polyneuropathy, unspecified: Secondary | ICD-10-CM | POA: Diagnosis not present

## 2020-04-29 DIAGNOSIS — E1140 Type 2 diabetes mellitus with diabetic neuropathy, unspecified: Secondary | ICD-10-CM | POA: Diagnosis not present

## 2020-04-29 DIAGNOSIS — I129 Hypertensive chronic kidney disease with stage 1 through stage 4 chronic kidney disease, or unspecified chronic kidney disease: Secondary | ICD-10-CM | POA: Diagnosis not present

## 2020-04-29 DIAGNOSIS — N182 Chronic kidney disease, stage 2 (mild): Secondary | ICD-10-CM | POA: Diagnosis not present

## 2020-04-29 DIAGNOSIS — Z0000 Encounter for general adult medical examination without abnormal findings: Secondary | ICD-10-CM | POA: Diagnosis not present

## 2020-04-29 DIAGNOSIS — R82998 Other abnormal findings in urine: Secondary | ICD-10-CM | POA: Diagnosis not present

## 2020-04-29 DIAGNOSIS — Z1331 Encounter for screening for depression: Secondary | ICD-10-CM | POA: Diagnosis not present

## 2020-04-29 DIAGNOSIS — M5416 Radiculopathy, lumbar region: Secondary | ICD-10-CM | POA: Diagnosis not present

## 2020-04-29 DIAGNOSIS — E785 Hyperlipidemia, unspecified: Secondary | ICD-10-CM | POA: Diagnosis not present

## 2020-10-31 DIAGNOSIS — I129 Hypertensive chronic kidney disease with stage 1 through stage 4 chronic kidney disease, or unspecified chronic kidney disease: Secondary | ICD-10-CM | POA: Diagnosis not present

## 2020-10-31 DIAGNOSIS — E781 Pure hyperglyceridemia: Secondary | ICD-10-CM | POA: Diagnosis not present

## 2020-10-31 DIAGNOSIS — N182 Chronic kidney disease, stage 2 (mild): Secondary | ICD-10-CM | POA: Diagnosis not present

## 2020-10-31 DIAGNOSIS — I81 Portal vein thrombosis: Secondary | ICD-10-CM | POA: Diagnosis not present

## 2020-10-31 DIAGNOSIS — E785 Hyperlipidemia, unspecified: Secondary | ICD-10-CM | POA: Diagnosis not present

## 2020-10-31 DIAGNOSIS — E1129 Type 2 diabetes mellitus with other diabetic kidney complication: Secondary | ICD-10-CM | POA: Diagnosis not present

## 2020-10-31 DIAGNOSIS — E1140 Type 2 diabetes mellitus with diabetic neuropathy, unspecified: Secondary | ICD-10-CM | POA: Diagnosis not present

## 2020-10-31 DIAGNOSIS — E669 Obesity, unspecified: Secondary | ICD-10-CM | POA: Diagnosis not present

## 2020-10-31 DIAGNOSIS — G629 Polyneuropathy, unspecified: Secondary | ICD-10-CM | POA: Diagnosis not present

## 2021-04-14 DIAGNOSIS — N182 Chronic kidney disease, stage 2 (mild): Secondary | ICD-10-CM | POA: Diagnosis not present

## 2021-04-14 DIAGNOSIS — E785 Hyperlipidemia, unspecified: Secondary | ICD-10-CM | POA: Diagnosis not present

## 2021-04-14 DIAGNOSIS — I129 Hypertensive chronic kidney disease with stage 1 through stage 4 chronic kidney disease, or unspecified chronic kidney disease: Secondary | ICD-10-CM | POA: Diagnosis not present

## 2021-04-14 DIAGNOSIS — I10 Essential (primary) hypertension: Secondary | ICD-10-CM | POA: Diagnosis not present

## 2021-05-15 DIAGNOSIS — E785 Hyperlipidemia, unspecified: Secondary | ICD-10-CM | POA: Diagnosis not present

## 2021-05-15 DIAGNOSIS — I10 Essential (primary) hypertension: Secondary | ICD-10-CM | POA: Diagnosis not present

## 2021-05-15 DIAGNOSIS — E1140 Type 2 diabetes mellitus with diabetic neuropathy, unspecified: Secondary | ICD-10-CM | POA: Diagnosis not present

## 2021-05-19 DIAGNOSIS — I129 Hypertensive chronic kidney disease with stage 1 through stage 4 chronic kidney disease, or unspecified chronic kidney disease: Secondary | ICD-10-CM | POA: Diagnosis not present

## 2021-05-19 DIAGNOSIS — E785 Hyperlipidemia, unspecified: Secondary | ICD-10-CM | POA: Diagnosis not present

## 2021-05-19 DIAGNOSIS — Z1339 Encounter for screening examination for other mental health and behavioral disorders: Secondary | ICD-10-CM | POA: Diagnosis not present

## 2021-05-19 DIAGNOSIS — N182 Chronic kidney disease, stage 2 (mild): Secondary | ICD-10-CM | POA: Diagnosis not present

## 2021-05-19 DIAGNOSIS — E669 Obesity, unspecified: Secondary | ICD-10-CM | POA: Diagnosis not present

## 2021-05-19 DIAGNOSIS — E1129 Type 2 diabetes mellitus with other diabetic kidney complication: Secondary | ICD-10-CM | POA: Diagnosis not present

## 2021-05-19 DIAGNOSIS — Z1331 Encounter for screening for depression: Secondary | ICD-10-CM | POA: Diagnosis not present

## 2021-05-19 DIAGNOSIS — Z0000 Encounter for general adult medical examination without abnormal findings: Secondary | ICD-10-CM | POA: Diagnosis not present

## 2021-05-19 DIAGNOSIS — G629 Polyneuropathy, unspecified: Secondary | ICD-10-CM | POA: Diagnosis not present

## 2021-05-19 DIAGNOSIS — E781 Pure hyperglyceridemia: Secondary | ICD-10-CM | POA: Diagnosis not present

## 2021-05-19 DIAGNOSIS — R82998 Other abnormal findings in urine: Secondary | ICD-10-CM | POA: Diagnosis not present

## 2021-05-19 DIAGNOSIS — E1140 Type 2 diabetes mellitus with diabetic neuropathy, unspecified: Secondary | ICD-10-CM | POA: Diagnosis not present

## 2021-06-13 DIAGNOSIS — I10 Essential (primary) hypertension: Secondary | ICD-10-CM | POA: Diagnosis not present

## 2021-06-13 DIAGNOSIS — I129 Hypertensive chronic kidney disease with stage 1 through stage 4 chronic kidney disease, or unspecified chronic kidney disease: Secondary | ICD-10-CM | POA: Diagnosis not present

## 2021-06-13 DIAGNOSIS — N182 Chronic kidney disease, stage 2 (mild): Secondary | ICD-10-CM | POA: Diagnosis not present

## 2021-06-13 DIAGNOSIS — E785 Hyperlipidemia, unspecified: Secondary | ICD-10-CM | POA: Diagnosis not present

## 2021-10-13 DIAGNOSIS — E1129 Type 2 diabetes mellitus with other diabetic kidney complication: Secondary | ICD-10-CM | POA: Diagnosis not present

## 2021-10-13 DIAGNOSIS — E785 Hyperlipidemia, unspecified: Secondary | ICD-10-CM | POA: Diagnosis not present

## 2021-10-13 DIAGNOSIS — I10 Essential (primary) hypertension: Secondary | ICD-10-CM | POA: Diagnosis not present

## 2021-10-13 DIAGNOSIS — K219 Gastro-esophageal reflux disease without esophagitis: Secondary | ICD-10-CM | POA: Diagnosis not present

## 2021-11-13 DIAGNOSIS — I10 Essential (primary) hypertension: Secondary | ICD-10-CM | POA: Diagnosis not present

## 2021-11-13 DIAGNOSIS — K219 Gastro-esophageal reflux disease without esophagitis: Secondary | ICD-10-CM | POA: Diagnosis not present

## 2021-11-13 DIAGNOSIS — E785 Hyperlipidemia, unspecified: Secondary | ICD-10-CM | POA: Diagnosis not present

## 2021-11-13 DIAGNOSIS — E1129 Type 2 diabetes mellitus with other diabetic kidney complication: Secondary | ICD-10-CM | POA: Diagnosis not present

## 2021-11-20 DIAGNOSIS — E781 Pure hyperglyceridemia: Secondary | ICD-10-CM | POA: Diagnosis not present

## 2021-11-20 DIAGNOSIS — E1140 Type 2 diabetes mellitus with diabetic neuropathy, unspecified: Secondary | ICD-10-CM | POA: Diagnosis not present

## 2021-11-20 DIAGNOSIS — E1129 Type 2 diabetes mellitus with other diabetic kidney complication: Secondary | ICD-10-CM | POA: Diagnosis not present

## 2021-11-20 DIAGNOSIS — I129 Hypertensive chronic kidney disease with stage 1 through stage 4 chronic kidney disease, or unspecified chronic kidney disease: Secondary | ICD-10-CM | POA: Diagnosis not present

## 2021-11-20 DIAGNOSIS — E785 Hyperlipidemia, unspecified: Secondary | ICD-10-CM | POA: Diagnosis not present

## 2021-11-20 DIAGNOSIS — N182 Chronic kidney disease, stage 2 (mild): Secondary | ICD-10-CM | POA: Diagnosis not present

## 2021-11-20 DIAGNOSIS — E669 Obesity, unspecified: Secondary | ICD-10-CM | POA: Diagnosis not present

## 2021-11-20 DIAGNOSIS — I10 Essential (primary) hypertension: Secondary | ICD-10-CM | POA: Diagnosis not present

## 2021-11-20 DIAGNOSIS — G629 Polyneuropathy, unspecified: Secondary | ICD-10-CM | POA: Diagnosis not present

## 2022-07-07 DIAGNOSIS — S8992XA Unspecified injury of left lower leg, initial encounter: Secondary | ICD-10-CM | POA: Diagnosis not present

## 2022-07-07 DIAGNOSIS — S80212A Abrasion, left knee, initial encounter: Secondary | ICD-10-CM | POA: Diagnosis not present

## 2022-07-07 DIAGNOSIS — R519 Headache, unspecified: Secondary | ICD-10-CM | POA: Diagnosis not present

## 2022-07-07 DIAGNOSIS — E041 Nontoxic single thyroid nodule: Secondary | ICD-10-CM | POA: Diagnosis not present

## 2022-07-07 DIAGNOSIS — Z043 Encounter for examination and observation following other accident: Secondary | ICD-10-CM | POA: Diagnosis not present

## 2022-07-07 DIAGNOSIS — S0990XA Unspecified injury of head, initial encounter: Secondary | ICD-10-CM | POA: Diagnosis not present

## 2022-07-07 DIAGNOSIS — M170 Bilateral primary osteoarthritis of knee: Secondary | ICD-10-CM | POA: Diagnosis not present

## 2022-07-07 DIAGNOSIS — I10 Essential (primary) hypertension: Secondary | ICD-10-CM | POA: Diagnosis not present

## 2022-07-07 DIAGNOSIS — T07XXXA Unspecified multiple injuries, initial encounter: Secondary | ICD-10-CM | POA: Diagnosis not present

## 2022-07-07 DIAGNOSIS — S8991XA Unspecified injury of right lower leg, initial encounter: Secondary | ICD-10-CM | POA: Diagnosis not present

## 2022-07-07 DIAGNOSIS — E119 Type 2 diabetes mellitus without complications: Secondary | ICD-10-CM | POA: Diagnosis not present

## 2022-07-07 DIAGNOSIS — S80211A Abrasion, right knee, initial encounter: Secondary | ICD-10-CM | POA: Diagnosis not present

## 2022-07-07 DIAGNOSIS — S0083XA Contusion of other part of head, initial encounter: Secondary | ICD-10-CM | POA: Diagnosis not present

## 2022-07-07 DIAGNOSIS — W108XXA Fall (on) (from) other stairs and steps, initial encounter: Secondary | ICD-10-CM | POA: Diagnosis not present

## 2022-07-27 DIAGNOSIS — E1129 Type 2 diabetes mellitus with other diabetic kidney complication: Secondary | ICD-10-CM | POA: Diagnosis not present

## 2022-07-27 DIAGNOSIS — I10 Essential (primary) hypertension: Secondary | ICD-10-CM | POA: Diagnosis not present

## 2022-07-27 DIAGNOSIS — R7989 Other specified abnormal findings of blood chemistry: Secondary | ICD-10-CM | POA: Diagnosis not present

## 2022-07-27 DIAGNOSIS — E785 Hyperlipidemia, unspecified: Secondary | ICD-10-CM | POA: Diagnosis not present

## 2022-07-27 DIAGNOSIS — K219 Gastro-esophageal reflux disease without esophagitis: Secondary | ICD-10-CM | POA: Diagnosis not present

## 2022-07-27 DIAGNOSIS — D72829 Elevated white blood cell count, unspecified: Secondary | ICD-10-CM | POA: Diagnosis not present

## 2022-08-03 DIAGNOSIS — N1831 Chronic kidney disease, stage 3a: Secondary | ICD-10-CM | POA: Diagnosis not present

## 2022-08-03 DIAGNOSIS — G629 Polyneuropathy, unspecified: Secondary | ICD-10-CM | POA: Diagnosis not present

## 2022-08-03 DIAGNOSIS — Z23 Encounter for immunization: Secondary | ICD-10-CM | POA: Diagnosis not present

## 2022-08-03 DIAGNOSIS — E785 Hyperlipidemia, unspecified: Secondary | ICD-10-CM | POA: Diagnosis not present

## 2022-08-03 DIAGNOSIS — Z1331 Encounter for screening for depression: Secondary | ICD-10-CM | POA: Diagnosis not present

## 2022-08-03 DIAGNOSIS — E1129 Type 2 diabetes mellitus with other diabetic kidney complication: Secondary | ICD-10-CM | POA: Diagnosis not present

## 2022-08-03 DIAGNOSIS — I129 Hypertensive chronic kidney disease with stage 1 through stage 4 chronic kidney disease, or unspecified chronic kidney disease: Secondary | ICD-10-CM | POA: Diagnosis not present

## 2022-08-03 DIAGNOSIS — Z1339 Encounter for screening examination for other mental health and behavioral disorders: Secondary | ICD-10-CM | POA: Diagnosis not present

## 2022-08-03 DIAGNOSIS — E781 Pure hyperglyceridemia: Secondary | ICD-10-CM | POA: Diagnosis not present

## 2022-08-03 DIAGNOSIS — E1140 Type 2 diabetes mellitus with diabetic neuropathy, unspecified: Secondary | ICD-10-CM | POA: Diagnosis not present

## 2022-08-03 DIAGNOSIS — Z0000 Encounter for general adult medical examination without abnormal findings: Secondary | ICD-10-CM | POA: Diagnosis not present

## 2022-08-03 DIAGNOSIS — I10 Essential (primary) hypertension: Secondary | ICD-10-CM | POA: Diagnosis not present

## 2022-08-06 ENCOUNTER — Other Ambulatory Visit: Payer: Self-pay | Admitting: Internal Medicine

## 2022-08-06 DIAGNOSIS — E041 Nontoxic single thyroid nodule: Secondary | ICD-10-CM

## 2022-08-22 DIAGNOSIS — I10 Essential (primary) hypertension: Secondary | ICD-10-CM | POA: Diagnosis not present

## 2022-08-22 DIAGNOSIS — R109 Unspecified abdominal pain: Secondary | ICD-10-CM | POA: Diagnosis not present

## 2022-08-22 DIAGNOSIS — R112 Nausea with vomiting, unspecified: Secondary | ICD-10-CM | POA: Diagnosis not present

## 2022-08-22 DIAGNOSIS — E860 Dehydration: Secondary | ICD-10-CM | POA: Diagnosis not present

## 2022-08-22 DIAGNOSIS — A059 Bacterial foodborne intoxication, unspecified: Secondary | ICD-10-CM | POA: Diagnosis not present

## 2022-08-22 DIAGNOSIS — R531 Weakness: Secondary | ICD-10-CM | POA: Diagnosis not present

## 2022-08-22 DIAGNOSIS — E119 Type 2 diabetes mellitus without complications: Secondary | ICD-10-CM | POA: Diagnosis not present

## 2022-08-22 DIAGNOSIS — K219 Gastro-esophageal reflux disease without esophagitis: Secondary | ICD-10-CM | POA: Diagnosis not present

## 2022-08-23 DIAGNOSIS — E860 Dehydration: Secondary | ICD-10-CM | POA: Diagnosis not present

## 2022-08-23 DIAGNOSIS — E119 Type 2 diabetes mellitus without complications: Secondary | ICD-10-CM | POA: Diagnosis not present

## 2022-08-23 DIAGNOSIS — A059 Bacterial foodborne intoxication, unspecified: Secondary | ICD-10-CM | POA: Diagnosis not present

## 2022-08-23 DIAGNOSIS — R112 Nausea with vomiting, unspecified: Secondary | ICD-10-CM | POA: Diagnosis not present

## 2022-08-23 DIAGNOSIS — R109 Unspecified abdominal pain: Secondary | ICD-10-CM | POA: Diagnosis not present

## 2022-08-23 DIAGNOSIS — K219 Gastro-esophageal reflux disease without esophagitis: Secondary | ICD-10-CM | POA: Diagnosis not present

## 2022-08-23 DIAGNOSIS — R531 Weakness: Secondary | ICD-10-CM | POA: Diagnosis not present

## 2022-08-23 DIAGNOSIS — I10 Essential (primary) hypertension: Secondary | ICD-10-CM | POA: Diagnosis not present

## 2022-10-02 DIAGNOSIS — Z1231 Encounter for screening mammogram for malignant neoplasm of breast: Secondary | ICD-10-CM | POA: Diagnosis not present

## 2022-10-02 DIAGNOSIS — R92313 Mammographic fatty tissue density, bilateral breasts: Secondary | ICD-10-CM | POA: Diagnosis not present

## 2023-01-04 DIAGNOSIS — T148XXA Other injury of unspecified body region, initial encounter: Secondary | ICD-10-CM | POA: Diagnosis not present

## 2023-01-04 DIAGNOSIS — N179 Acute kidney failure, unspecified: Secondary | ICD-10-CM | POA: Diagnosis not present

## 2023-01-04 DIAGNOSIS — N3090 Cystitis, unspecified without hematuria: Secondary | ICD-10-CM | POA: Diagnosis not present

## 2023-01-04 DIAGNOSIS — R441 Visual hallucinations: Secondary | ICD-10-CM | POA: Diagnosis not present

## 2023-01-04 DIAGNOSIS — N390 Urinary tract infection, site not specified: Secondary | ICD-10-CM | POA: Diagnosis not present

## 2023-01-04 DIAGNOSIS — E876 Hypokalemia: Secondary | ICD-10-CM | POA: Diagnosis not present

## 2023-01-08 DIAGNOSIS — N1831 Chronic kidney disease, stage 3a: Secondary | ICD-10-CM | POA: Diagnosis not present

## 2023-01-08 DIAGNOSIS — E1129 Type 2 diabetes mellitus with other diabetic kidney complication: Secondary | ICD-10-CM | POA: Diagnosis not present

## 2023-01-08 DIAGNOSIS — I129 Hypertensive chronic kidney disease with stage 1 through stage 4 chronic kidney disease, or unspecified chronic kidney disease: Secondary | ICD-10-CM | POA: Diagnosis not present

## 2023-01-31 DIAGNOSIS — E669 Obesity, unspecified: Secondary | ICD-10-CM | POA: Diagnosis not present

## 2023-01-31 DIAGNOSIS — F419 Anxiety disorder, unspecified: Secondary | ICD-10-CM | POA: Diagnosis not present

## 2023-01-31 DIAGNOSIS — N1831 Chronic kidney disease, stage 3a: Secondary | ICD-10-CM | POA: Diagnosis not present

## 2023-01-31 DIAGNOSIS — E781 Pure hyperglyceridemia: Secondary | ICD-10-CM | POA: Diagnosis not present

## 2023-01-31 DIAGNOSIS — Z23 Encounter for immunization: Secondary | ICD-10-CM | POA: Diagnosis not present

## 2023-01-31 DIAGNOSIS — E041 Nontoxic single thyroid nodule: Secondary | ICD-10-CM | POA: Diagnosis not present

## 2023-01-31 DIAGNOSIS — I129 Hypertensive chronic kidney disease with stage 1 through stage 4 chronic kidney disease, or unspecified chronic kidney disease: Secondary | ICD-10-CM | POA: Diagnosis not present

## 2023-01-31 DIAGNOSIS — E1129 Type 2 diabetes mellitus with other diabetic kidney complication: Secondary | ICD-10-CM | POA: Diagnosis not present

## 2024-02-24 ENCOUNTER — Other Ambulatory Visit: Payer: Self-pay | Admitting: Internal Medicine

## 2024-02-24 DIAGNOSIS — E041 Nontoxic single thyroid nodule: Secondary | ICD-10-CM
